# Patient Record
Sex: Male | Born: 1940 | Race: Black or African American | Hispanic: No | Marital: Single | Smoking: Former smoker
Health system: Southern US, Community
[De-identification: ages and names within clinical notes are randomized; demographics above are authoritative.]

## PROBLEM LIST (undated history)

## (undated) ENCOUNTER — Emergency Department (HOSPITAL_COMMUNITY): Admission: EM | Payer: Medicare Other | Source: Home / Self Care

## (undated) DIAGNOSIS — E785 Hyperlipidemia, unspecified: Secondary | ICD-10-CM

## (undated) DIAGNOSIS — N289 Disorder of kidney and ureter, unspecified: Secondary | ICD-10-CM

## (undated) DIAGNOSIS — I4891 Unspecified atrial fibrillation: Secondary | ICD-10-CM

## (undated) DIAGNOSIS — I509 Heart failure, unspecified: Secondary | ICD-10-CM

## (undated) DIAGNOSIS — K922 Gastrointestinal hemorrhage, unspecified: Secondary | ICD-10-CM

## (undated) DIAGNOSIS — H409 Unspecified glaucoma: Secondary | ICD-10-CM

## (undated) DIAGNOSIS — R0600 Dyspnea, unspecified: Secondary | ICD-10-CM

## (undated) DIAGNOSIS — C801 Malignant (primary) neoplasm, unspecified: Secondary | ICD-10-CM

## (undated) DIAGNOSIS — I10 Essential (primary) hypertension: Secondary | ICD-10-CM

## (undated) HISTORY — DX: Hyperlipidemia, unspecified: E78.5

## (undated) HISTORY — DX: Disorder of kidney and ureter, unspecified: N28.9

## (undated) HISTORY — DX: Essential (primary) hypertension: I10

## (undated) HISTORY — DX: Gastrointestinal hemorrhage, unspecified: K92.2

## (undated) HISTORY — DX: Unspecified glaucoma: H40.9

---

## 2001-12-02 ENCOUNTER — Emergency Department (HOSPITAL_COMMUNITY): Admission: EM | Admit: 2001-12-02 | Discharge: 2001-12-02 | Payer: Self-pay

## 2001-12-06 ENCOUNTER — Encounter (INDEPENDENT_AMBULATORY_CARE_PROVIDER_SITE_OTHER): Payer: Self-pay | Admitting: Specialist

## 2001-12-06 ENCOUNTER — Encounter: Payer: Self-pay | Admitting: Emergency Medicine

## 2001-12-07 ENCOUNTER — Inpatient Hospital Stay (HOSPITAL_COMMUNITY): Admission: EM | Admit: 2001-12-07 | Discharge: 2001-12-18 | Payer: Self-pay | Admitting: Emergency Medicine

## 2001-12-19 ENCOUNTER — Encounter: Admission: RE | Admit: 2001-12-19 | Discharge: 2001-12-19 | Payer: Self-pay

## 2001-12-21 ENCOUNTER — Inpatient Hospital Stay (HOSPITAL_COMMUNITY): Admission: EM | Admit: 2001-12-21 | Discharge: 2001-12-27 | Payer: Self-pay

## 2001-12-21 ENCOUNTER — Encounter: Payer: Self-pay | Admitting: Infectious Diseases

## 2001-12-22 ENCOUNTER — Encounter: Payer: Self-pay | Admitting: Infectious Diseases

## 2001-12-23 ENCOUNTER — Encounter: Payer: Self-pay | Admitting: Infectious Diseases

## 2003-09-28 ENCOUNTER — Encounter: Admission: RE | Admit: 2003-09-28 | Discharge: 2003-09-28 | Payer: Self-pay | Admitting: Cardiovascular Disease

## 2005-10-20 ENCOUNTER — Ambulatory Visit (HOSPITAL_COMMUNITY): Admission: RE | Admit: 2005-10-20 | Discharge: 2005-10-20 | Payer: Self-pay | Admitting: Cardiovascular Disease

## 2006-03-12 ENCOUNTER — Encounter: Admission: RE | Admit: 2006-03-12 | Discharge: 2006-03-12 | Payer: Self-pay | Admitting: Cardiovascular Disease

## 2008-10-15 HISTORY — PX: OTHER SURGICAL HISTORY: SHX169

## 2009-10-01 ENCOUNTER — Emergency Department (HOSPITAL_COMMUNITY): Admission: EM | Admit: 2009-10-01 | Discharge: 2009-10-01 | Payer: Self-pay | Admitting: Emergency Medicine

## 2009-10-09 ENCOUNTER — Emergency Department (HOSPITAL_COMMUNITY): Admission: EM | Admit: 2009-10-09 | Discharge: 2009-10-09 | Payer: Self-pay | Admitting: Emergency Medicine

## 2009-10-10 ENCOUNTER — Ambulatory Visit: Payer: Self-pay | Admitting: Pulmonary Disease

## 2009-10-10 ENCOUNTER — Inpatient Hospital Stay (HOSPITAL_COMMUNITY): Admission: EM | Admit: 2009-10-10 | Discharge: 2009-10-25 | Payer: Self-pay | Admitting: Emergency Medicine

## 2009-10-11 ENCOUNTER — Ambulatory Visit: Payer: Self-pay | Admitting: Thoracic Surgery

## 2009-10-11 ENCOUNTER — Encounter: Payer: Self-pay | Admitting: Internal Medicine

## 2009-10-11 HISTORY — PX: BRONCHOSCOPY: SUR163

## 2009-10-12 ENCOUNTER — Encounter: Payer: Self-pay | Admitting: Internal Medicine

## 2009-10-13 ENCOUNTER — Telehealth (INDEPENDENT_AMBULATORY_CARE_PROVIDER_SITE_OTHER): Payer: Self-pay | Admitting: *Deleted

## 2009-10-15 ENCOUNTER — Encounter: Payer: Self-pay | Admitting: Internal Medicine

## 2009-11-10 ENCOUNTER — Ambulatory Visit: Payer: Self-pay | Admitting: Thoracic Surgery

## 2009-11-10 ENCOUNTER — Encounter: Admission: RE | Admit: 2009-11-10 | Discharge: 2009-11-10 | Payer: Self-pay | Admitting: Thoracic Surgery

## 2009-11-22 ENCOUNTER — Ambulatory Visit: Payer: Self-pay | Admitting: Internal Medicine

## 2009-11-22 DIAGNOSIS — I10 Essential (primary) hypertension: Secondary | ICD-10-CM

## 2009-11-22 DIAGNOSIS — D0220 Carcinoma in situ of unspecified bronchus and lung: Secondary | ICD-10-CM

## 2009-11-22 DIAGNOSIS — R042 Hemoptysis: Secondary | ICD-10-CM | POA: Insufficient documentation

## 2009-11-30 ENCOUNTER — Encounter: Payer: Self-pay | Admitting: Internal Medicine

## 2009-11-30 LAB — COMPREHENSIVE METABOLIC PANEL
ALT: 14 U/L (ref 0–53)
AST: 23 U/L (ref 0–37)
Alkaline Phosphatase: 89 U/L (ref 39–117)
CO2: 23 mEq/L (ref 19–32)
Calcium: 9.3 mg/dL (ref 8.4–10.5)
Creatinine, Ser: 1.68 mg/dL — ABNORMAL HIGH (ref 0.40–1.50)
Glucose, Bld: 96 mg/dL (ref 70–99)
Sodium: 140 mEq/L (ref 135–145)
Total Bilirubin: 0.5 mg/dL (ref 0.3–1.2)
Total Protein: 7.2 g/dL (ref 6.0–8.3)

## 2009-11-30 LAB — CBC WITH DIFFERENTIAL/PLATELET
Eosinophils Absolute: 0.2 10*3/uL (ref 0.0–0.5)
LYMPH%: 22.2 % (ref 14.0–49.0)
MCHC: 33.8 g/dL (ref 32.0–36.0)
MONO#: 0.6 10*3/uL (ref 0.1–0.9)
NEUT%: 58.9 % (ref 39.0–75.0)
RDW: 14.5 % (ref 11.0–14.6)

## 2009-12-08 ENCOUNTER — Encounter: Admission: RE | Admit: 2009-12-08 | Discharge: 2009-12-08 | Payer: Self-pay | Admitting: Thoracic Surgery

## 2009-12-08 ENCOUNTER — Encounter: Payer: Self-pay | Admitting: Internal Medicine

## 2009-12-08 ENCOUNTER — Ambulatory Visit: Payer: Self-pay | Admitting: Thoracic Surgery

## 2010-01-21 ENCOUNTER — Ambulatory Visit: Payer: Self-pay | Admitting: Internal Medicine

## 2010-01-21 DIAGNOSIS — J449 Chronic obstructive pulmonary disease, unspecified: Secondary | ICD-10-CM

## 2010-01-21 DIAGNOSIS — J4489 Other specified chronic obstructive pulmonary disease: Secondary | ICD-10-CM | POA: Insufficient documentation

## 2010-03-02 ENCOUNTER — Ambulatory Visit: Payer: Self-pay | Admitting: Thoracic Surgery

## 2010-03-02 ENCOUNTER — Encounter: Payer: Self-pay | Admitting: Internal Medicine

## 2010-03-02 ENCOUNTER — Encounter: Admission: RE | Admit: 2010-03-02 | Discharge: 2010-03-02 | Payer: Self-pay | Admitting: Thoracic Surgery

## 2010-05-19 ENCOUNTER — Ambulatory Visit: Payer: Self-pay | Admitting: Internal Medicine

## 2010-05-25 ENCOUNTER — Ambulatory Visit (HOSPITAL_COMMUNITY): Admission: RE | Admit: 2010-05-25 | Discharge: 2010-05-25 | Payer: Self-pay | Admitting: Internal Medicine

## 2010-05-25 LAB — COMPREHENSIVE METABOLIC PANEL
AST: 33 U/L (ref 0–37)
CO2: 25 mEq/L (ref 19–32)
Glucose, Bld: 104 mg/dL — ABNORMAL HIGH (ref 70–99)
Potassium: 3.3 mEq/L — ABNORMAL LOW (ref 3.5–5.3)
Sodium: 141 mEq/L (ref 135–145)
Total Bilirubin: 0.8 mg/dL (ref 0.3–1.2)

## 2010-05-25 LAB — CBC WITH DIFFERENTIAL/PLATELET
BASO%: 0.5 % (ref 0.0–2.0)
Basophils Absolute: 0 10*3/uL (ref 0.0–0.1)
Eosinophils Absolute: 0.1 10*3/uL (ref 0.0–0.5)
HGB: 11.9 g/dL — ABNORMAL LOW (ref 13.0–17.1)
MCH: 30.9 pg (ref 27.2–33.4)
MCV: 89.5 fL (ref 79.3–98.0)
NEUT%: 63.3 % (ref 39.0–75.0)
Platelets: 160 10*3/uL (ref 140–400)
RDW: 14.5 % (ref 11.0–14.6)
WBC: 4.3 10*3/uL (ref 4.0–10.3)
lymph#: 1 10*3/uL (ref 0.9–3.3)

## 2010-05-30 ENCOUNTER — Encounter: Payer: Self-pay | Admitting: Internal Medicine

## 2010-06-01 ENCOUNTER — Encounter: Admission: RE | Admit: 2010-06-01 | Discharge: 2010-06-01 | Payer: Self-pay | Admitting: Thoracic Surgery

## 2010-06-01 ENCOUNTER — Ambulatory Visit: Payer: Self-pay | Admitting: Thoracic Surgery

## 2010-06-15 ENCOUNTER — Encounter: Admission: RE | Admit: 2010-06-15 | Discharge: 2010-06-15 | Payer: Self-pay | Admitting: Cardiovascular Disease

## 2010-08-08 ENCOUNTER — Emergency Department (HOSPITAL_COMMUNITY): Admission: EM | Admit: 2010-08-08 | Discharge: 2010-08-08 | Payer: Self-pay | Admitting: Emergency Medicine

## 2010-08-20 ENCOUNTER — Ambulatory Visit: Payer: Self-pay | Admitting: Internal Medicine

## 2010-08-21 ENCOUNTER — Inpatient Hospital Stay (HOSPITAL_COMMUNITY): Admission: EM | Admit: 2010-08-21 | Discharge: 2010-08-31 | Payer: Self-pay | Admitting: Emergency Medicine

## 2010-08-22 ENCOUNTER — Encounter: Payer: Self-pay | Admitting: Internal Medicine

## 2010-08-23 ENCOUNTER — Encounter: Admission: RE | Admit: 2010-08-23 | Payer: Self-pay | Admitting: Thoracic Surgery

## 2010-08-23 ENCOUNTER — Ambulatory Visit: Payer: Self-pay | Admitting: Thoracic Surgery

## 2010-09-29 ENCOUNTER — Telehealth (INDEPENDENT_AMBULATORY_CARE_PROVIDER_SITE_OTHER): Payer: Self-pay | Admitting: *Deleted

## 2010-10-13 ENCOUNTER — Encounter
Admission: RE | Admit: 2010-10-13 | Discharge: 2010-10-13 | Payer: Self-pay | Source: Home / Self Care | Attending: Cardiovascular Disease | Admitting: Cardiovascular Disease

## 2010-10-29 ENCOUNTER — Encounter: Payer: Self-pay | Admitting: Thoracic Surgery

## 2010-10-30 ENCOUNTER — Encounter: Payer: Self-pay | Admitting: Thoracic Surgery

## 2010-11-08 NOTE — Assessment & Plan Note (Signed)
Summary: rov after pft ///kp   CC:  Follow up visit after PFT.Rick Tyler  History of Present Illness: History of Present Illness: November 22, 2009- Hx hemoptysis, Carcinoma in situ, RLL lobectomy  Post hospital ([facility] 1/2-1/17/11) visit for this 70 yoM who presented through [facility] ER  with new onset episodic gross hemoptysis. Hospital notes are in [place]. CT showed no focal lesion, but suggested bleeding in right mid/lower lung.  I attempted bronchoscopy, aborted by renewed bleeding. He was bronchoscoped in the OR by Dr Edwyna Shell, discovering carcinoma -in-situ. He then had VATS Riight Lower Lobectomy([id number]). There has been no more bleeding. Residual post-thoracotomy pain, but endurance is improving. Hx of bleeding on coumadin for cardiomyopathy years ago. He was told he was a "bleeder and should never have coumadin". Recent ECHO by Dr Algie Coffer looked much better. He had quit ETOH and Tobacco years ago. He asks now if he should have some kind of adjuvant cancer therapy.  January 21, 2010- Hx hemoptysis, RLL lobectomy for carcinoma in situ Hitting a lot of golf balls. He notices a little post thoracotomy pull in right chest. Notices some dyspnea walking up very steep hill on golf course. Needs a rescue inhaler. He is intending to play Investment banker, corporate and expects to be able to shoot his age (!!). He denies productive cough, blood, chest pain, palpitation, syncope or ankle edema. PFT- Moderate restriction, mild obstruction with R to BD. DLCO moderately reduced 58%.  FEV1 2.23/ 70%; FEV1/FVC 0.73   Current Medications (verified): 1)  Lasix 20 Mg Tabs (Furosemide) .... Take 1 or 2  By Mouth Once Daily As Needed Lower Extremity Swelling 2)  Amiodarone Hcl 200 Mg Tabs (Amiodarone Hcl) .... Take 1 By Mouth Once Daily 3)  Carvedilol 12.5 Mg Tabs (Carvedilol) .... Take 1 By Mouth Two Times A Day 4)  Lipitor 10 Mg Tabs (Atorvastatin Calcium) .... Take 1 By Mouth Once Daily 5)  Nitrostat 0.4 Mg  Subl (Nitroglycerin) .... Use As Directed 6)  Tramadol Hcl 50 Mg Tabs (Tramadol Hcl) .Rick Tyler.. 1-2 Four Times A Day As Needed Pain  Allergies (verified): 1)  ! Coumadin  Past History:  Past Medical History: Hypertension GI Bleed, Hx Acute renal insufficiency- 2011 Cardiomyopathy. 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  The patient denies anorexia, fever, weight loss, weight gain, vision loss, decreased hearing, hoarseness, syncope, peripheral edema, prolonged cough, headaches, hemoptysis, abdominal pain, and severe indigestion/heartburn.    Vital Signs:  Patient profile:   70 year old male Height:      73 inches Weight:      187 pounds BMI:     24.76 O2 Sat:      94 % on Room air Pulse rate:   64 / minute BP sitting:   142 / 84  (left arm) Cuff size:   regular  Vitals Entered By: Reynaldo Minium CMA (January 21, 2010 10:45 AM)  O2 Flow:  Room air  Physical Exam  Additional Exam:  General: A/Ox3; pleasant and cooperative, NAD, tall, trim SKIN: no rash, lesions NODES: no lymphadenopathy HEENT: [place]/AT, EOM- WNL, Conjuctivae- clear, PERRLA, TM-WNL, Nose- clear, Throat- clear and wnl NECK: Supple w/ fair ROM, JVD- none, normal carotid impulses w/o bruits Thyroid- normal to palpation CHEST:  Clear to P&A, diminished, unlabored. Room air sat at rest 94% HEART: RRR, no m/g/r heard ABDOMEN: Soft and nl;  ZOX:WRUE, nl pulses, ? trace edema  NEURO: Grossly intact to observation      Impression & Recommendations:  Problem # 1:  COPD (ICD-496) Mild obstruction mainly in small airways, with reponse to bronchodilator. He also has mild to moderate restriction consistent with his lobectomy and known cardiomegaly.  I have handwritten a note supporting his request for  permission to use golf cart during upcoming tournament. We discussed and provided a rescue inhaler. He isn't needing mantenance Advair at this time.  Medications Added to Medication List This Visit: 1)  Proair Hfa 108 (90 Base) Mcg/act Aers (Albuterol sulfate) .... 2 puffs four times a day as needed rescue  Other Orders: Est. Patient Level III (45409) Prescription Created Electronically [phone])  Patient Instructions: 1)  Please schedule a follow-up appointment in 6 months. 2)  Golf cart note 3)  Script for rescue inhaler sent to your drug store. Prescriptions: PROAIR HFA 108 (90 BASE) MCG/ACT AERS (ALBUTEROL SULFATE) 2 puffs four times a day as needed rescue  #1 x prn   Entered and Authorized by:   Waymon Budge MD   Signed by:   Waymon Budge MD on 01/21/2010   Method used:   Electronically to        [facility]* (retail)       [address]       [place], [place]  [address]       Ph: [phone]       Fax: [phone]   RxID:   [id number]

## 2010-11-08 NOTE — Progress Notes (Signed)
Summary: transfer order  Phone Note From Other Clinic   Caller: Shanta at [facility] Call For: Young Summary of Call: Nurse at [facility] is asking for a transfer order for this patient. They are wanting to transfer pt to a tele bed or reg floor bed. Pt is currently in room 2609. Call back # [phone]. Please advise.  Initial call taken by: Carron Curie CMA,  October 13, 2009 9:58 AM  Follow-up for Phone Call        per CDY: okay to transfer pt to a medical floor, nontelemetry bed.  called spoke with [facility] and advised her of CDY's recs.  she verbalized her understanding. Boone Master CNA  October 13, 2009 11:03 AM

## 2010-11-08 NOTE — Assessment & Plan Note (Signed)
Summary: hfu/jd   CC:  Post hospital.  History of Present Illness: November 22, 2009- Hx hemoptysis, Carcinoma in situ, RLL lobectomy  Post hospital ([facility] 1/2-1/17/11) visit for this 70 yoM who presented through [facility] ER  with new onset episodic gross hemoptysis. Hospital notes are in [place]. CT showed no focal lesion, but suggested bleeding in right mid/lower lung.  I attempted bronchoscopy, aborted by renewed bleeding. He was bronchoscoped in the OR by Dr Edwyna Shell, discovering carcinoma -in-situ. He then had VATS Riight Lower Lobectomy([id number]). There has been no more bleeding. Residual post-thoracotomy pain, but endurance is improving. Hx of bleeding on coumadin for cardiomyopathy years ago. He was told he was a "bleeder and should never have coumadin". Recent ECHO by Dr Algie Coffer looked much better. He had quit ETOH and Tobacco years ago. He asks now if he should have some kind of adjuvant cancer therapy.   Preventive Screening-Counseling & Management  Alcohol-Tobacco     Smoking Status: quit  Current Medications (verified): 1)  Advair Diskus 250-50 Mcg/dose Aepb (Fluticasone-Salmeterol) .Marland Kitchen.. 1 Puff Two Times A Day and Rinse Mouth Well 2)  B Complex-Iron  Tabs (B Complex-C-Iron) .... Take 1 By Mouth Once Daily 3)  Lasix 20 Mg Tabs (Furosemide) .... Take 1 By Mouth Once Daily As Needed Lower Extremity Swelling 4)  Amiodarone Hcl 200 Mg Tabs (Amiodarone Hcl) .... Take 1 By Mouth Once Daily 5)  Carvedilol 12.5 Mg Tabs (Carvedilol) .... Take 1 By Mouth Two Times A Day 6)  Lipitor 10 Mg Tabs (Atorvastatin Calcium) .... Take 1 By Mouth Once Daily 7)  Nitrostat 0.4 Mg Subl (Nitroglycerin) .... Use As Directed  Allergies (verified): 1)  ! Coumadin  Past History:  Past Medical History: Hypertension GI Bleed, Hx Acute renal insufficiency- 2011 Cardiomyopathy. 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  Social History: Patient states former smoker. -remote ETOH, quit, remote Doctor, hospital, Golf ProSmoking Status:  quit  Review of Systems      See HPI       The patient complains of chest pain.  The patient denies anorexia, fever, weight loss, weight gain, vision loss, decreased hearing, hoarseness, syncope, dyspnea on exertion, peripheral edema, prolonged cough, headaches, hemoptysis, abdominal pain, and severe indigestion/heartburn.         Post thoracotomy pain. Resolved hemoptysis  Vital Signs:  Patient profile:   70 year old male Weight:      185.50 pounds O2 Sat:      100 % on Room air Pulse rate:   74 / minute BP sitting:   142 / 88  (right arm) Cuff size:   regular  Vitals Entered By: Reynaldo Minium CMA (November 22, 2009 3:33 PM)  O2 Flow:  Room air  Physical Exam  Additional Exam:  General: A/Ox3; pleasant and cooperative, NAD, tall, trim SKIN: no rash, lesions NODES: no lymphadenopathy HEENT: [place]/AT, EOM- WNL, Conjuctivae- clear, PERRLA, TM-WNL, Nose- clear, Throat- clear and wnl NECK: Supple w/ fair ROM, JVD- none, normal carotid impulses w/o bruits Thyroid- normal to palpation CHEST: Clear to P&A, diminished, unlabored. 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 Probably both problems were cured by Dr Scheryl Darter RLL resection. Since the bleeding implies vascular access to the cancer, and he would like oncology opinion about adjuvant therapy, I will make referral. He is taking tramadol and needs refill. We can check PFT. I don't know how much baseline lung  disease he has, if any.  Problem # 2:  HEMOPTYSIS UNSPECIFIED (ICD-786.30) He had a GI bleed in the past. I don't know if there is really any documented coagulopathy, but he was told years ago he was a "bleeder".He's had several weeks now of iron supplementation. He is back on a regular diet. We can check Hg, if Oncology hasn't, when he returns for a PFT. The following medications were removed from the medication list:    B Complex-iron Tabs (B complex-c-iron) .Marland Kitchen... Take 1 by mouth once daily His updated medication list for this problem includes:    Advair Diskus 250-50 Mcg/dose Aepb (Fluticasone-salmeterol) .Marland Kitchen... 1 puff two times a day and rinse mouth well  Medications Added to Medication List This Visit: 1)  Advair Diskus 250-50 Mcg/dose Aepb (Fluticasone-salmeterol) .Marland Kitchen.. 1 puff two times a day and rinse mouth well 2)  B Complex-iron Tabs (B complex-c-iron) .... Take 1 by mouth once daily 3)  Lasix 20 Mg Tabs (Furosemide) .... Take 1 by mouth once daily as needed lower extremity swelling 4)  Lasix 20 Mg Tabs (Furosemide) .... Take 1 or 2  by mouth once daily as needed lower extremity swelling 5)  Amiodarone Hcl 200 Mg Tabs (Amiodarone hcl) .... Take 1 by mouth once daily 6)  Carvedilol 12.5 Mg Tabs (Carvedilol) .... Take 1 by mouth two times a day 7)  Lipitor 10 Mg Tabs (Atorvastatin calcium) .... Take 1 by mouth once daily 8)  Nitrostat 0.4 Mg Subl (Nitroglycerin) .... Use as directed 9)  Tramadol Hcl 50 Mg Tabs (Tramadol hcl) .Marland Kitchen.. 1-2 four times a day as needed pain  Other Orders: Est. Patient Level III ([address]) Oncology Referral (Oncology)  Patient Instructions: 1)  Please schedule a follow-up appointment in 1 month. 2)  Schedule PFT 3)  Ok to drop off iron and Advair now. if we find you need them they can be restarted.  4)  OK to take 20 - 40 mg lasix/ furosemide daily as needed. Ask Dr Algie Coffer for guidance with this if you have questions. 5)  see [facility] to arrange referral to an  oncologist. 6)  Refill script for tramadol for chest pain Prescriptions: TRAMADOL HCL 50 MG TABS (TRAMADOL HCL) 1-2 four times a day as needed pain  #50 x 1   Entered and Authorized by:   Waymon Budge MD   Signed by:   Waymon Budge MD on 11/22/2009   Method used:   Print then Give to Patient   RxID:   [id number]

## 2010-11-08 NOTE — Miscellaneous (Signed)
Summary: follow-up  ---- Converted from flag ---- ---- 10/12/2009 11:48 AM, Philipp Deputy CMA wrote:   ---- 10/09/2009 6:30 PM, Kalman Shan MD wrote: Dr Sherrine Maples from [facility] ER called just now. Says patient presented to ER with 40cc hemoptysis. CT shows alveolar infiltrates mild. Patinet refusing admission due to dog and social issues. Says will sign out AMA. Patient agreeable for ouptatient followup apparently  I advised Dr Sherrine Maples to reiterate to patient that he should get admitted for serious condition. If patient refuses, then he is at his own risk. Patient could follow at our office [phone] on or after monday 10/11/2009. Advised that patient should call our office for appt. In any event, pls call patient and try to give him appt on monday. Told Dr. Sherrine Maples we cannot guarantee that he will be seen on 1/3 in an office for life threatening issue  Patient details" Rick Tyler 03/15/1941. Not in our system. phone is [phone]. ------------------------------  pt decided to stay in hospital. he is being followed by our MDs. he will be advised to f/u once discharged. Carron Curie CMA  October 12, 2009 4:54 PM

## 2010-11-08 NOTE — Letter (Signed)
Summary: [facility]  [facility]   Imported By: Lester Fillmore 03/16/2010 10:43:49  _____________________________________________________________________  External Attachment:    Type:   Image     Comment:   External Document

## 2010-11-08 NOTE — Letter (Signed)
Summary: [facility]  [facility]   Imported By: Lester Fall Branch 06/20/2010 08:32:22  _____________________________________________________________________  External Attachment:    Type:   Image     Comment:   External Document

## 2010-11-08 NOTE — Consult Note (Signed)
Summary: [facility]  [facility]   Imported By: Lennie Odor 12/29/2009 11:31:19  _____________________________________________________________________  External Attachment:    Type:   Image     Comment:   External Document

## 2010-11-08 NOTE — Miscellaneous (Signed)
Summary: Orders Update pft charges  Clinical Lists Changes  Orders: Added new Service order of Carbon Monoxide diffusing w/capacity (94720) - Signed Added new Service order of Lung Volumes (94240) - Signed Added new Service order of Spirometry (Pre & Post) (94060) - Signed 

## 2010-11-08 NOTE — Letter (Signed)
[facility]: [facility]   Imported By: Sherian Rein 04/04/2010 14:14:46  _____________________________________________________________________  External Attachment:    Type:   Image     Comment:   External Document

## 2010-11-10 NOTE — Progress Notes (Signed)
Summary: Increased use of Proair HFA  Phone Note Outgoing Call Call back at [facility] Phone [phone]   Call placed by: Reynaldo Minium CMA,  September 29, 2010 5:05 PM Call placed to: Patient Summary of Call: Karin Golden sent fax stating patient was using ProAir HFA inhaler too much-would like to see if CDY would like to give patient a steroid inhaler. Per CDY okay to have patient come by and get sample of Advair 250/50 and Proair HFA inhalers. Megan put samples at front and pt is aware to come tomorrow and pick up-once patient gets here I will review how to use Advair with him. Initial call taken by: Reynaldo Minium CMA,  September 29, 2010 5:13 PM

## 2010-11-29 ENCOUNTER — Ambulatory Visit (INDEPENDENT_AMBULATORY_CARE_PROVIDER_SITE_OTHER): Payer: Medicare Other | Admitting: Thoracic Surgery

## 2010-11-29 ENCOUNTER — Encounter: Payer: Self-pay | Admitting: Internal Medicine

## 2010-11-29 DIAGNOSIS — C3430 Malignant neoplasm of lower lobe, unspecified bronchus or lung: Secondary | ICD-10-CM

## 2010-11-30 NOTE — Assessment & Plan Note (Signed)
OFFICE VISIT  Rick Tyler, Rick Tyler DOB:  05-18-1941                                        November 29, 2010 CHART #:  [id number]  HISTORY:  The patient is a 70 year old black male who is status post right lower lobectomy and lymph node dissection for a squamous cell carcinoma of the right lower lobe.  He was seen on today's date in routine followup for reevaluation of a CT scan done for continued ongoing surveillance.  Currently, he reports no new symptoms and is overall feeling fairly well.  He denies hemoptysis.  He denies significant shortness of breath.  A chest CT scan was obtained on October 13, 2010, and was reviewed by Dr. Edwyna Shell.  It shows no active process, no evidence of recurrence of lung carcinoma.  There is no change in the calcified pleural plaques on the left side and there is resolution of previous foci of the airspace disease.  PHYSICAL EXAMINATION:  VITAL SIGNS:  Blood pressure 108/71, pulse 72, respirations 18, oxygen saturation 96% on room air. GENERAL APPEARANCE:  Well-developed adult male in no acute distress. PULMONARY:  Clear lungs. CARDIAC:  Irregularly irregular.  ASSESSMENT:  Rick Tyler is quite stable.  He does have a long history of atrial fibrillation and is under the care and management of Dr. Algie Coffer for this.  He is not a candidate for Coumadin therapy due to previous history of difficulties with bleeding.  He is quite stable from a thoracic surgical viewpoint.  He has elected not to follow up with Dr. Shirline Frees after seeing him on one time.  We will see him again in the office in 9 months with a repeat CT scan at that time.  [name], P.A.-C.  [name]  D:  11/29/2010  T:  11/30/2010  Job:  [id number]  cc:   Rick Tyler, M.D. Clinton D. Maple Hudson, MD, FCCP, FACP

## 2010-12-20 LAB — CULTURE, BLOOD (ROUTINE X 2)
Culture  Setup Time: 201111140005
Culture: NO GROWTH

## 2010-12-20 LAB — POCT I-STAT 3, ART BLOOD GAS (G3+)
O2 Saturation: 99 %
TCO2: 32 mmol/L (ref 0–100)
pH, Arterial: 7.368 (ref 7.350–7.450)

## 2010-12-20 LAB — BASIC METABOLIC PANEL
BUN: 30 mg/dL — ABNORMAL HIGH (ref 6–23)
BUN: 35 mg/dL — ABNORMAL HIGH (ref 6–23)
BUN: 40 mg/dL — ABNORMAL HIGH (ref 6–23)
BUN: 44 mg/dL — ABNORMAL HIGH (ref 6–23)
CO2: 22 mEq/L (ref 19–32)
CO2: 23 mEq/L (ref 19–32)
CO2: 28 mEq/L (ref 19–32)
CO2: 31 mEq/L (ref 19–32)
CO2: 31 mEq/L (ref 19–32)
Calcium: 8.4 mg/dL (ref 8.4–10.5)
Calcium: 8.8 mg/dL (ref 8.4–10.5)
Chloride: 102 mEq/L (ref 96–112)
Chloride: 103 mEq/L (ref 96–112)
Chloride: 105 mEq/L (ref 96–112)
Chloride: 110 mEq/L (ref 96–112)
Chloride: 110 mEq/L (ref 96–112)
Chloride: 98 mEq/L (ref 96–112)
Creatinine, Ser: 2.04 mg/dL — ABNORMAL HIGH (ref 0.4–1.5)
Creatinine, Ser: 2.07 mg/dL — ABNORMAL HIGH (ref 0.4–1.5)
Creatinine, Ser: 2.69 mg/dL — ABNORMAL HIGH (ref 0.4–1.5)
GFR calc Af Amer: 28 mL/min — ABNORMAL LOW (ref >60)
GFR calc Af Amer: 31 mL/min — ABNORMAL LOW (ref >60)
GFR calc Af Amer: 33 mL/min — ABNORMAL LOW (ref >60)
GFR calc Af Amer: 35 mL/min — ABNORMAL LOW (ref >60)
GFR calc Af Amer: 38 mL/min — ABNORMAL LOW (ref >60)
GFR calc Af Amer: 38 mL/min — ABNORMAL LOW (ref >60)
GFR calc non Af Amer: 24 mL/min — ABNORMAL LOW (ref >60)
GFR calc non Af Amer: 29 mL/min — ABNORMAL LOW (ref >60)
Glucose, Bld: 102 mg/dL — ABNORMAL HIGH (ref 70–99)
Glucose, Bld: 131 mg/dL — ABNORMAL HIGH (ref 70–99)
Glucose, Bld: 99 mg/dL (ref 70–99)
Potassium: 4.6 mEq/L (ref 3.5–5.1)
Potassium: 4.7 mEq/L (ref 3.5–5.1)
Potassium: 4.7 mEq/L (ref 3.5–5.1)
Potassium: 4.7 mEq/L (ref 3.5–5.1)
Potassium: 5.1 mEq/L (ref 3.5–5.1)
Sodium: 137 mEq/L (ref 135–145)
Sodium: 143 mEq/L (ref 135–145)

## 2010-12-20 LAB — BLOOD GAS, ARTERIAL
Acid-base deficit: 3.6 mmol/L — ABNORMAL HIGH (ref 0.0–2.0)
Bicarbonate: 20.5 mEq/L (ref 20.0–24.0)
Delivery systems: POSITIVE
Delivery systems: POSITIVE
Drawn by: 229971
Drawn by: 311371
Expiratory PAP: 6
FIO2: 0.5 %
FIO2: 1 %
Inspiratory PAP: 12
Inspiratory PAP: 12
Patient temperature: 98.6
Patient temperature: 98.6
TCO2: 21.5 mmol/L (ref 0–100)
TCO2: 25.4 mmol/L (ref 0–100)
pCO2 arterial: 34.3 mmHg — ABNORMAL LOW (ref 35.0–45.0)
pCO2 arterial: 35.9 mmHg (ref 35.0–45.0)
pCO2 arterial: 38.8 mmHg (ref 35.0–45.0)
pH, Arterial: 7.393 (ref 7.350–7.450)
pH, Arterial: 7.396 (ref 7.350–7.450)
pH, Arterial: 7.412 (ref 7.350–7.450)
pO2, Arterial: 50.4 mmHg — ABNORMAL LOW (ref 80.0–100.0)

## 2010-12-20 LAB — PROTIME-INR
INR: 1.17 (ref 0.00–1.49)
INR: 1.33 (ref 0.00–1.49)
Prothrombin Time: 15.1 seconds (ref 11.6–15.2)

## 2010-12-20 LAB — DIFFERENTIAL
Basophils Absolute: 0 10*3/uL (ref 0.0–0.1)
Basophils Relative: 0 % (ref 0–1)
Basophils Relative: 0 % (ref 0–1)
Basophils Relative: 1 % (ref 0–1)
Basophils Relative: 1 % (ref 0–1)
Eosinophils Absolute: 0.2 10*3/uL (ref 0.0–0.7)
Eosinophils Absolute: 0.3 10*3/uL (ref 0.0–0.7)
Eosinophils Relative: 3 % (ref 0–5)
Eosinophils Relative: 3 % (ref 0–5)
Lymphocytes Relative: 8 % — ABNORMAL LOW (ref 12–46)
Lymphs Abs: 0.5 10*3/uL — ABNORMAL LOW (ref 0.7–4.0)
Lymphs Abs: 0.7 10*3/uL (ref 0.7–4.0)
Lymphs Abs: 0.7 10*3/uL (ref 0.7–4.0)
Lymphs Abs: 0.9 10*3/uL (ref 0.7–4.0)
Monocytes Absolute: 1.2 10*3/uL — ABNORMAL HIGH (ref 0.1–1.0)
Monocytes Absolute: 1.2 10*3/uL — ABNORMAL HIGH (ref 0.1–1.0)
Monocytes Absolute: 1.2 10*3/uL — ABNORMAL HIGH (ref 0.1–1.0)
Monocytes Absolute: 1.5 10*3/uL — ABNORMAL HIGH (ref 0.1–1.0)
Monocytes Relative: 11 % (ref 3–12)
Monocytes Relative: 11 % (ref 3–12)
Monocytes Relative: 12 % (ref 3–12)
Monocytes Relative: 15 % — ABNORMAL HIGH (ref 3–12)
Neutro Abs: 7.4 10*3/uL (ref 1.7–7.7)
Neutro Abs: 7.7 10*3/uL (ref 1.7–7.7)
Neutro Abs: 8.5 10*3/uL — ABNORMAL HIGH (ref 1.7–7.7)
Neutrophils Relative %: 79 % — ABNORMAL HIGH (ref 43–77)
Neutrophils Relative %: 79 % — ABNORMAL HIGH (ref 43–77)
Neutrophils Relative %: 82 % — ABNORMAL HIGH (ref 43–77)

## 2010-12-20 LAB — PHOSPHORUS
Phosphorus: 3.7 mg/dL (ref 2.3–4.6)
Phosphorus: 3.8 mg/dL (ref 2.3–4.6)

## 2010-12-20 LAB — CARDIAC PANEL(CRET KIN+CKTOT+MB+TROPI)
CK, MB: 4.6 ng/mL — ABNORMAL HIGH (ref 0.3–4.0)
CK, MB: 5.7 ng/mL — ABNORMAL HIGH (ref 0.3–4.0)
Relative Index: 3.2 — ABNORMAL HIGH (ref 0.0–2.5)
Relative Index: 3.4 — ABNORMAL HIGH (ref 0.0–2.5)
Relative Index: 3.5 — ABNORMAL HIGH (ref 0.0–2.5)
Relative Index: INVALID (ref 0.0–2.5)
Relative Index: INVALID (ref 0.0–2.5)
Troponin I: 0.03 ng/mL (ref 0.00–0.06)
Troponin I: 0.04 ng/mL (ref 0.00–0.06)
Troponin I: 0.05 ng/mL (ref 0.00–0.06)

## 2010-12-20 LAB — CBC
HCT: 32.8 % — ABNORMAL LOW (ref 39.0–52.0)
HCT: 33.1 % — ABNORMAL LOW (ref 39.0–52.0)
HCT: 37.1 % — ABNORMAL LOW (ref 39.0–52.0)
HCT: 38.3 % — ABNORMAL LOW (ref 39.0–52.0)
Hemoglobin: 11 g/dL — ABNORMAL LOW (ref 13.0–17.0)
Hemoglobin: 11.3 g/dL — ABNORMAL LOW (ref 13.0–17.0)
Hemoglobin: 11.8 g/dL — ABNORMAL LOW (ref 13.0–17.0)
Hemoglobin: 11.9 g/dL — ABNORMAL LOW (ref 13.0–17.0)
MCH: 27.9 pg (ref 26.0–34.0)
MCH: 28 pg (ref 26.0–34.0)
MCH: 28.1 pg (ref 26.0–34.0)
MCH: 28.3 pg (ref 26.0–34.0)
MCHC: 32.1 g/dL (ref 30.0–36.0)
MCHC: 32.8 g/dL (ref 30.0–36.0)
MCHC: 33.1 g/dL (ref 30.0–36.0)
MCHC: 33.1 g/dL (ref 30.0–36.0)
MCHC: 33.2 g/dL (ref 30.0–36.0)
MCHC: 33.2 g/dL (ref 30.0–36.0)
MCV: 86.8 fL (ref 78.0–100.0)
MCV: 87.2 fL (ref 78.0–100.0)
Platelets: 204 10*3/uL (ref 150–400)
Platelets: 207 10*3/uL (ref 150–400)
Platelets: 252 10*3/uL (ref 150–400)
RBC: 3.86 MIL/uL — ABNORMAL LOW (ref 4.22–5.81)
RBC: 3.86 MIL/uL — ABNORMAL LOW (ref 4.22–5.81)
RBC: 3.91 MIL/uL — ABNORMAL LOW (ref 4.22–5.81)
RBC: 4.19 MIL/uL — ABNORMAL LOW (ref 4.22–5.81)
RBC: 4.21 MIL/uL — ABNORMAL LOW (ref 4.22–5.81)
RDW: 14.3 % (ref 11.5–15.5)
RDW: 14.3 % (ref 11.5–15.5)
RDW: 14.5 % (ref 11.5–15.5)
WBC: 10.8 10*3/uL — ABNORMAL HIGH (ref 4.0–10.5)
WBC: 4.6 10*3/uL (ref 4.0–10.5)
WBC: 8.6 10*3/uL (ref 4.0–10.5)
WBC: 8.8 10*3/uL (ref 4.0–10.5)

## 2010-12-20 LAB — EXPECTORATED SPUTUM ASSESSMENT W GRAM STAIN, RFLX TO RESP C

## 2010-12-20 LAB — URINE CULTURE: Culture: NO GROWTH

## 2010-12-20 LAB — PROCALCITONIN: Procalcitonin: 0.76 ng/mL

## 2010-12-20 LAB — TROPONIN I: Troponin I: 0.06 ng/mL (ref 0.00–0.06)

## 2010-12-20 LAB — COMPREHENSIVE METABOLIC PANEL
Alkaline Phosphatase: 78 U/L (ref 39–117)
CO2: 23 mEq/L (ref 19–32)
Chloride: 106 mEq/L (ref 96–112)
Sodium: 140 mEq/L (ref 135–145)
Total Protein: 6.9 g/dL (ref 6.0–8.3)

## 2010-12-20 LAB — BRAIN NATRIURETIC PEPTIDE
Pro B Natriuretic peptide (BNP): 1548 pg/mL — ABNORMAL HIGH (ref 0.0–100.0)
Pro B Natriuretic peptide (BNP): 432 pg/mL — ABNORMAL HIGH (ref 0.0–100.0)
Pro B Natriuretic peptide (BNP): 96 pg/mL (ref 0.0–100.0)

## 2010-12-20 LAB — HEPATIC FUNCTION PANEL
AST: 30 U/L (ref 0–37)
Albumin: 2.5 g/dL — ABNORMAL LOW (ref 3.5–5.2)
Total Bilirubin: 0.7 mg/dL (ref 0.3–1.2)
Total Protein: 6.3 g/dL (ref 6.0–8.3)

## 2010-12-20 LAB — MAGNESIUM: Magnesium: 2.1 mg/dL (ref 1.5–2.5)

## 2010-12-20 LAB — GLUCOSE, CAPILLARY: Glucose-Capillary: 102 mg/dL — ABNORMAL HIGH (ref 70–99)

## 2010-12-20 LAB — LIPID PANEL
Cholesterol: 112 mg/dL (ref 0–200)
HDL: 38 mg/dL — ABNORMAL LOW (ref >39)
Total CHOL/HDL Ratio: 2.9 RATIO

## 2010-12-20 LAB — POCT I-STAT 3, VENOUS BLOOD GAS (G3P V)
Acid-Base Excess: 3 mmol/L — ABNORMAL HIGH (ref 0.0–2.0)
O2 Saturation: 60 %
pO2, Ven: 33 mmHg (ref 30.0–45.0)

## 2010-12-20 LAB — APTT: aPTT: 39 seconds — ABNORMAL HIGH (ref 24–37)

## 2010-12-20 LAB — LACTIC ACID, PLASMA: Lactic Acid, Venous: 1.4 mmol/L (ref 0.5–2.2)

## 2010-12-20 LAB — CK TOTAL AND CKMB (NOT AT ARMC): Total CK: 238 U/L — ABNORMAL HIGH (ref 7–232)

## 2010-12-20 LAB — MRSA PCR SCREENING: MRSA by PCR: NEGATIVE

## 2010-12-20 LAB — HEPARIN LEVEL (UNFRACTIONATED): Heparin Unfractionated: 0.26 IU/mL — ABNORMAL LOW (ref 0.30–0.70)

## 2010-12-20 NOTE — Letter (Signed)
Summary: [facility]  [facility]   Imported By: Sherian Rein 12/14/2010 12:59:33  _____________________________________________________________________  External Attachment:    Type:   Image     Comment:   External Document

## 2010-12-21 LAB — CBC
HCT: 35.7 % — ABNORMAL LOW (ref 39.0–52.0)
Hemoglobin: 11.8 g/dL — ABNORMAL LOW (ref 13.0–17.0)
RDW: 13.8 % (ref 11.5–15.5)
WBC: 6.9 10*3/uL (ref 4.0–10.5)

## 2010-12-21 LAB — POCT CARDIAC MARKERS
CKMB, poc: 6.2 ng/mL (ref 1.0–8.0)
Myoglobin, poc: >500 ng/mL (ref 12–200)
Troponin i, poc: <0.05 ng/mL (ref 0.00–0.09)

## 2010-12-21 LAB — DIFFERENTIAL
Basophils Absolute: 0 10*3/uL (ref 0.0–0.1)
Lymphocytes Relative: 24 % (ref 12–46)
Monocytes Absolute: 0.9 10*3/uL (ref 0.1–1.0)
Neutro Abs: 4.2 10*3/uL (ref 1.7–7.7)
Neutrophils Relative %: 62 % (ref 43–77)

## 2010-12-21 LAB — POCT I-STAT, CHEM 8
BUN: 35 mg/dL — ABNORMAL HIGH (ref 6–23)
Calcium, Ion: 1.08 mmol/L — ABNORMAL LOW (ref 1.12–1.32)
Chloride: 108 mEq/L (ref 96–112)
Potassium: 4 mEq/L (ref 3.5–5.1)

## 2010-12-21 LAB — BRAIN NATRIURETIC PEPTIDE: Pro B Natriuretic peptide (BNP): 1333 pg/mL — ABNORMAL HIGH (ref 0.0–100.0)

## 2010-12-25 LAB — CBC
HCT: 25.2 % — ABNORMAL LOW (ref 39.0–52.0)
HCT: 25.9 % — ABNORMAL LOW (ref 39.0–52.0)
HCT: 28.4 % — ABNORMAL LOW (ref 39.0–52.0)
HCT: 29.7 % — ABNORMAL LOW (ref 39.0–52.0)
HCT: 31.4 % — ABNORMAL LOW (ref 39.0–52.0)
HCT: 35.2 % — ABNORMAL LOW (ref 39.0–52.0)
Hemoglobin: 10.3 g/dL — ABNORMAL LOW (ref 13.0–17.0)
Hemoglobin: 10.7 g/dL — ABNORMAL LOW (ref 13.0–17.0)
Hemoglobin: 12.2 g/dL — ABNORMAL LOW (ref 13.0–17.0)
Hemoglobin: 9.8 g/dL — ABNORMAL LOW (ref 13.0–17.0)
MCHC: 34.1 g/dL (ref 30.0–36.0)
MCHC: 34.3 g/dL (ref 30.0–36.0)
MCHC: 34.3 g/dL (ref 30.0–36.0)
MCHC: 34.5 g/dL (ref 30.0–36.0)
MCHC: 34.5 g/dL (ref 30.0–36.0)
MCHC: 34.7 g/dL (ref 30.0–36.0)
MCHC: 34.8 g/dL (ref 30.0–36.0)
MCV: 92.6 fL (ref 78.0–100.0)
MCV: 92.9 fL (ref 78.0–100.0)
MCV: 93.3 fL (ref 78.0–100.0)
MCV: 93.5 fL (ref 78.0–100.0)
Platelets: 126 10*3/uL — ABNORMAL LOW (ref 150–400)
Platelets: 130 10*3/uL — ABNORMAL LOW (ref 150–400)
Platelets: 138 10*3/uL — ABNORMAL LOW (ref 150–400)
Platelets: 139 10*3/uL — ABNORMAL LOW (ref 150–400)
Platelets: 150 10*3/uL (ref 150–400)
Platelets: 182 10*3/uL (ref 150–400)
Platelets: 186 10*3/uL (ref 150–400)
Platelets: 238 10*3/uL (ref 150–400)
RBC: 2.7 MIL/uL — ABNORMAL LOW (ref 4.22–5.81)
RBC: 2.8 MIL/uL — ABNORMAL LOW (ref 4.22–5.81)
RBC: 2.82 MIL/uL — ABNORMAL LOW (ref 4.22–5.81)
RBC: 3.04 MIL/uL — ABNORMAL LOW (ref 4.22–5.81)
RBC: 3.19 MIL/uL — ABNORMAL LOW (ref 4.22–5.81)
RBC: 4.23 MIL/uL (ref 4.22–5.81)
RDW: 12.8 % (ref 11.5–15.5)
RDW: 12.8 % (ref 11.5–15.5)
RDW: 12.9 % (ref 11.5–15.5)
RDW: 13.1 % (ref 11.5–15.5)
RDW: 13.2 % (ref 11.5–15.5)
WBC: 11.8 10*3/uL — ABNORMAL HIGH (ref 4.0–10.5)
WBC: 4.6 10*3/uL (ref 4.0–10.5)
WBC: 8.9 10*3/uL (ref 4.0–10.5)

## 2010-12-25 LAB — CROSSMATCH
ABO/RH(D): A POS
Antibody Screen: NEGATIVE

## 2010-12-25 LAB — COMPREHENSIVE METABOLIC PANEL
ALT: 28 U/L (ref 0–53)
Albumin: 2.7 g/dL — ABNORMAL LOW (ref 3.5–5.2)
Alkaline Phosphatase: 47 U/L (ref 39–117)
Alkaline Phosphatase: 61 U/L (ref 39–117)
BUN: 16 mg/dL (ref 6–23)
CO2: 26 mEq/L (ref 19–32)
Calcium: 8.6 mg/dL (ref 8.4–10.5)
Chloride: 107 mEq/L (ref 96–112)
Creatinine, Ser: 1.81 mg/dL — ABNORMAL HIGH (ref 0.4–1.5)
GFR calc Af Amer: 34 mL/min — ABNORMAL LOW (ref >60)
GFR calc non Af Amer: 37 mL/min — ABNORMAL LOW (ref >60)
Glucose, Bld: 102 mg/dL — ABNORMAL HIGH (ref 70–99)
Glucose, Bld: 109 mg/dL — ABNORMAL HIGH (ref 70–99)
Potassium: 3.6 mEq/L (ref 3.5–5.1)
Potassium: 4.7 mEq/L (ref 3.5–5.1)
Sodium: 131 mEq/L — ABNORMAL LOW (ref 135–145)
Total Bilirubin: 0.7 mg/dL (ref 0.3–1.2)
Total Protein: 5.8 g/dL — ABNORMAL LOW (ref 6.0–8.3)

## 2010-12-25 LAB — TYPE AND SCREEN: Antibody Screen: NEGATIVE

## 2010-12-25 LAB — BASIC METABOLIC PANEL
BUN: 11 mg/dL (ref 6–23)
BUN: 17 mg/dL (ref 6–23)
BUN: 19 mg/dL (ref 6–23)
BUN: 27 mg/dL — ABNORMAL HIGH (ref 6–23)
BUN: 30 mg/dL — ABNORMAL HIGH (ref 6–23)
BUN: 33 mg/dL — ABNORMAL HIGH (ref 6–23)
BUN: 35 mg/dL — ABNORMAL HIGH (ref 6–23)
BUN: 41 mg/dL — ABNORMAL HIGH (ref 6–23)
BUN: 23 mg/dL (ref 6–23)
BUN: 26 mg/dL — ABNORMAL HIGH (ref 6–23)
CO2: 23 mEq/L (ref 19–32)
CO2: 24 mEq/L (ref 19–32)
CO2: 24 mEq/L (ref 19–32)
CO2: 24 mEq/L (ref 19–32)
CO2: 26 mEq/L (ref 19–32)
CO2: 23 mEq/L (ref 19–32)
Calcium: 8.5 mg/dL (ref 8.4–10.5)
Calcium: 8.8 mg/dL (ref 8.4–10.5)
Calcium: 9 mg/dL (ref 8.4–10.5)
Calcium: 9.6 mg/dL (ref 8.4–10.5)
Calcium: 8.2 mg/dL — ABNORMAL LOW (ref 8.4–10.5)
Chloride: 106 mEq/L (ref 96–112)
Chloride: 107 mEq/L (ref 96–112)
Chloride: 108 mEq/L (ref 96–112)
Chloride: 110 mEq/L (ref 96–112)
Chloride: 110 mEq/L (ref 96–112)
Creatinine, Ser: 1.58 mg/dL — ABNORMAL HIGH (ref 0.4–1.5)
Creatinine, Ser: 1.64 mg/dL — ABNORMAL HIGH (ref 0.4–1.5)
Creatinine, Ser: 1.76 mg/dL — ABNORMAL HIGH (ref 0.4–1.5)
Creatinine, Ser: 1.79 mg/dL — ABNORMAL HIGH (ref 0.4–1.5)
Creatinine, Ser: 1.91 mg/dL — ABNORMAL HIGH (ref 0.4–1.5)
Creatinine, Ser: 1.56 mg/dL — ABNORMAL HIGH (ref 0.4–1.5)
Creatinine, Ser: 1.58 mg/dL — ABNORMAL HIGH (ref 0.4–1.5)
GFR calc Af Amer: 36 mL/min — ABNORMAL LOW (ref >60)
GFR calc Af Amer: 51 mL/min — ABNORMAL LOW (ref >60)
GFR calc Af Amer: 53 mL/min — ABNORMAL LOW (ref >60)
GFR calc non Af Amer: 30 mL/min — ABNORMAL LOW (ref >60)
GFR calc non Af Amer: 42 mL/min — ABNORMAL LOW (ref >60)
GFR calc non Af Amer: 42 mL/min — ABNORMAL LOW (ref >60)
GFR calc non Af Amer: 54 mL/min — ABNORMAL LOW (ref >60)
GFR calc non Af Amer: 44 mL/min — ABNORMAL LOW (ref >60)
GFR calc non Af Amer: 44 mL/min — ABNORMAL LOW (ref >60)
Glucose, Bld: 104 mg/dL — ABNORMAL HIGH (ref 70–99)
Glucose, Bld: 106 mg/dL — ABNORMAL HIGH (ref 70–99)
Glucose, Bld: 127 mg/dL — ABNORMAL HIGH (ref 70–99)
Glucose, Bld: 97 mg/dL (ref 70–99)
Glucose, Bld: 99 mg/dL (ref 70–99)
Glucose, Bld: 105 mg/dL — ABNORMAL HIGH (ref 70–99)
Glucose, Bld: 96 mg/dL (ref 70–99)
Potassium: 4.2 mEq/L (ref 3.5–5.1)
Potassium: 4.6 mEq/L (ref 3.5–5.1)
Potassium: 4.7 mEq/L (ref 3.5–5.1)
Sodium: 130 mEq/L — ABNORMAL LOW (ref 135–145)
Sodium: 133 mEq/L — ABNORMAL LOW (ref 135–145)
Sodium: 136 mEq/L (ref 135–145)

## 2010-12-25 LAB — FUNGUS CULTURE W SMEAR: Fungal Smear: NONE SEEN

## 2010-12-25 LAB — DIFFERENTIAL
Basophils Absolute: 0 10*3/uL (ref 0.0–0.1)
Basophils Relative: 0 % (ref 0–1)
Basophils Relative: 1 % (ref 0–1)
Eosinophils Relative: 0 % (ref 0–5)
Eosinophils Relative: 4 % (ref 0–5)
Lymphocytes Relative: 23 % (ref 12–46)
Lymphocytes Relative: 30 % (ref 12–46)
Lymphocytes Relative: 6 % — ABNORMAL LOW (ref 12–46)
Lymphs Abs: 1.4 10*3/uL (ref 0.7–4.0)
Monocytes Absolute: 0.5 10*3/uL (ref 0.1–1.0)
Monocytes Absolute: 2 10*3/uL — ABNORMAL HIGH (ref 0.1–1.0)
Monocytes Relative: 11 % (ref 3–12)
Monocytes Relative: 15 % — ABNORMAL HIGH (ref 3–12)
Neutro Abs: 10.3 10*3/uL — ABNORMAL HIGH (ref 1.7–7.7)
Neutro Abs: 2.4 10*3/uL (ref 1.7–7.7)
Neutro Abs: 2.5 10*3/uL (ref 1.7–7.7)
Neutrophils Relative %: 52 % (ref 43–77)

## 2010-12-25 LAB — CULTURE, RESPIRATORY W GRAM STAIN: Gram Stain: NONE SEEN

## 2010-12-25 LAB — URINALYSIS, ROUTINE W REFLEX MICROSCOPIC
Bilirubin Urine: NEGATIVE
Ketones, ur: NEGATIVE mg/dL
Nitrite: NEGATIVE
Nitrite: NEGATIVE
Protein, ur: NEGATIVE mg/dL
Specific Gravity, Urine: 1.022 (ref 1.005–1.030)
Urobilinogen, UA: 0.2 mg/dL (ref 0.0–1.0)
Urobilinogen, UA: 0.2 mg/dL (ref 0.0–1.0)
pH: 5.5 (ref 5.0–8.0)
pH: 5.5 (ref 5.0–8.0)

## 2010-12-25 LAB — GLUCOSE, CAPILLARY
Glucose-Capillary: 108 mg/dL — ABNORMAL HIGH (ref 70–99)
Glucose-Capillary: 126 mg/dL — ABNORMAL HIGH (ref 70–99)

## 2010-12-25 LAB — POCT I-STAT 3, ART BLOOD GAS (G3+)
Acid-base deficit: 1 mmol/L (ref 0.0–2.0)
Acid-base deficit: 1 mmol/L (ref 0.0–2.0)
Bicarbonate: 25.2 mEq/L — ABNORMAL HIGH (ref 20.0–24.0)
O2 Saturation: 95 %
Patient temperature: 36.9
Patient temperature: 99.3
TCO2: 27 mmol/L (ref 0–100)
pO2, Arterial: 77 mmHg — ABNORMAL LOW (ref 80.0–100.0)

## 2010-12-25 LAB — CULTURE, BLOOD (ROUTINE X 2): Culture: NO GROWTH

## 2010-12-25 LAB — PROTIME-INR
INR: 0.98 (ref 0.00–1.49)
Prothrombin Time: 12.9 seconds (ref 11.6–15.2)

## 2010-12-25 LAB — RENAL FUNCTION PANEL
Albumin: 1.9 g/dL — ABNORMAL LOW (ref 3.5–5.2)
Chloride: 108 mEq/L (ref 96–112)
GFR calc Af Amer: 39 mL/min — ABNORMAL LOW (ref >60)
GFR calc non Af Amer: 32 mL/min — ABNORMAL LOW (ref >60)
Potassium: 4 mEq/L (ref 3.5–5.1)

## 2010-12-25 LAB — URINE MICROSCOPIC-ADD ON

## 2010-12-25 LAB — BRAIN NATRIURETIC PEPTIDE
Pro B Natriuretic peptide (BNP): 298 pg/mL — ABNORMAL HIGH (ref 0.0–100.0)
Pro B Natriuretic peptide (BNP): 328 pg/mL — ABNORMAL HIGH (ref 0.0–100.0)

## 2010-12-25 LAB — SEDIMENTATION RATE: Sed Rate: 10 mm/hr (ref 0–16)

## 2010-12-25 LAB — NA AND K (SODIUM & POTASSIUM), RAND UR: Sodium, Ur: <10 mEq/L

## 2010-12-25 LAB — URINE CULTURE
Colony Count: NO GROWTH
Culture: NO GROWTH

## 2010-12-25 LAB — D-DIMER, QUANTITATIVE: D-Dimer, Quant: 0.34 ug/mL-FEU (ref 0.00–0.48)

## 2010-12-25 LAB — AFB CULTURE WITH SMEAR (NOT AT ARMC)

## 2010-12-25 LAB — EXPECTORATED SPUTUM ASSESSMENT W GRAM STAIN, RFLX TO RESP C

## 2010-12-25 LAB — APTT
aPTT: 29 seconds (ref 24–37)
aPTT: 30 seconds (ref 24–37)

## 2011-01-09 LAB — CBC
HCT: 38.8 % — ABNORMAL LOW (ref 39.0–52.0)
Platelets: 139 10*3/uL — ABNORMAL LOW (ref 150–400)
RDW: 13.1 % (ref 11.5–15.5)
WBC: 3.1 10*3/uL — ABNORMAL LOW (ref 4.0–10.5)

## 2011-01-09 LAB — DIFFERENTIAL
Basophils Absolute: 0 10*3/uL (ref 0.0–0.1)
Eosinophils Absolute: 0.1 10*3/uL (ref 0.0–0.7)
Eosinophils Relative: 5 % (ref 0–5)
Lymphocytes Relative: 31 % (ref 12–46)
Lymphs Abs: 1 10*3/uL (ref 0.7–4.0)
Neutrophils Relative %: 49 % (ref 43–77)

## 2011-02-13 ENCOUNTER — Ambulatory Visit
Admission: RE | Admit: 2011-02-13 | Discharge: 2011-02-13 | Disposition: A | Discharge status: Home / Self Care | Payer: Medicare Other | Source: Ambulatory Visit | Attending: Cardiovascular Disease | Admitting: Cardiovascular Disease

## 2011-02-13 ENCOUNTER — Other Ambulatory Visit: Payer: Self-pay | Admitting: Cardiovascular Disease

## 2011-02-13 DIAGNOSIS — R0602 Shortness of breath: Secondary | ICD-10-CM

## 2011-02-19 IMAGING — CR DG CHEST 1V PORT
1 series · 1 of 1 positions shown · non-contrast
Comparison: Portable chest x-rays yesterday and 08/20/2010.

CLINICAL DATA: Decompensated systolic heart failure.  Atrial
fibrillation with rapid ventricular response.

PORTABLE CHEST - 1 VIEW [DATE]/[id number] hours:

[AP]
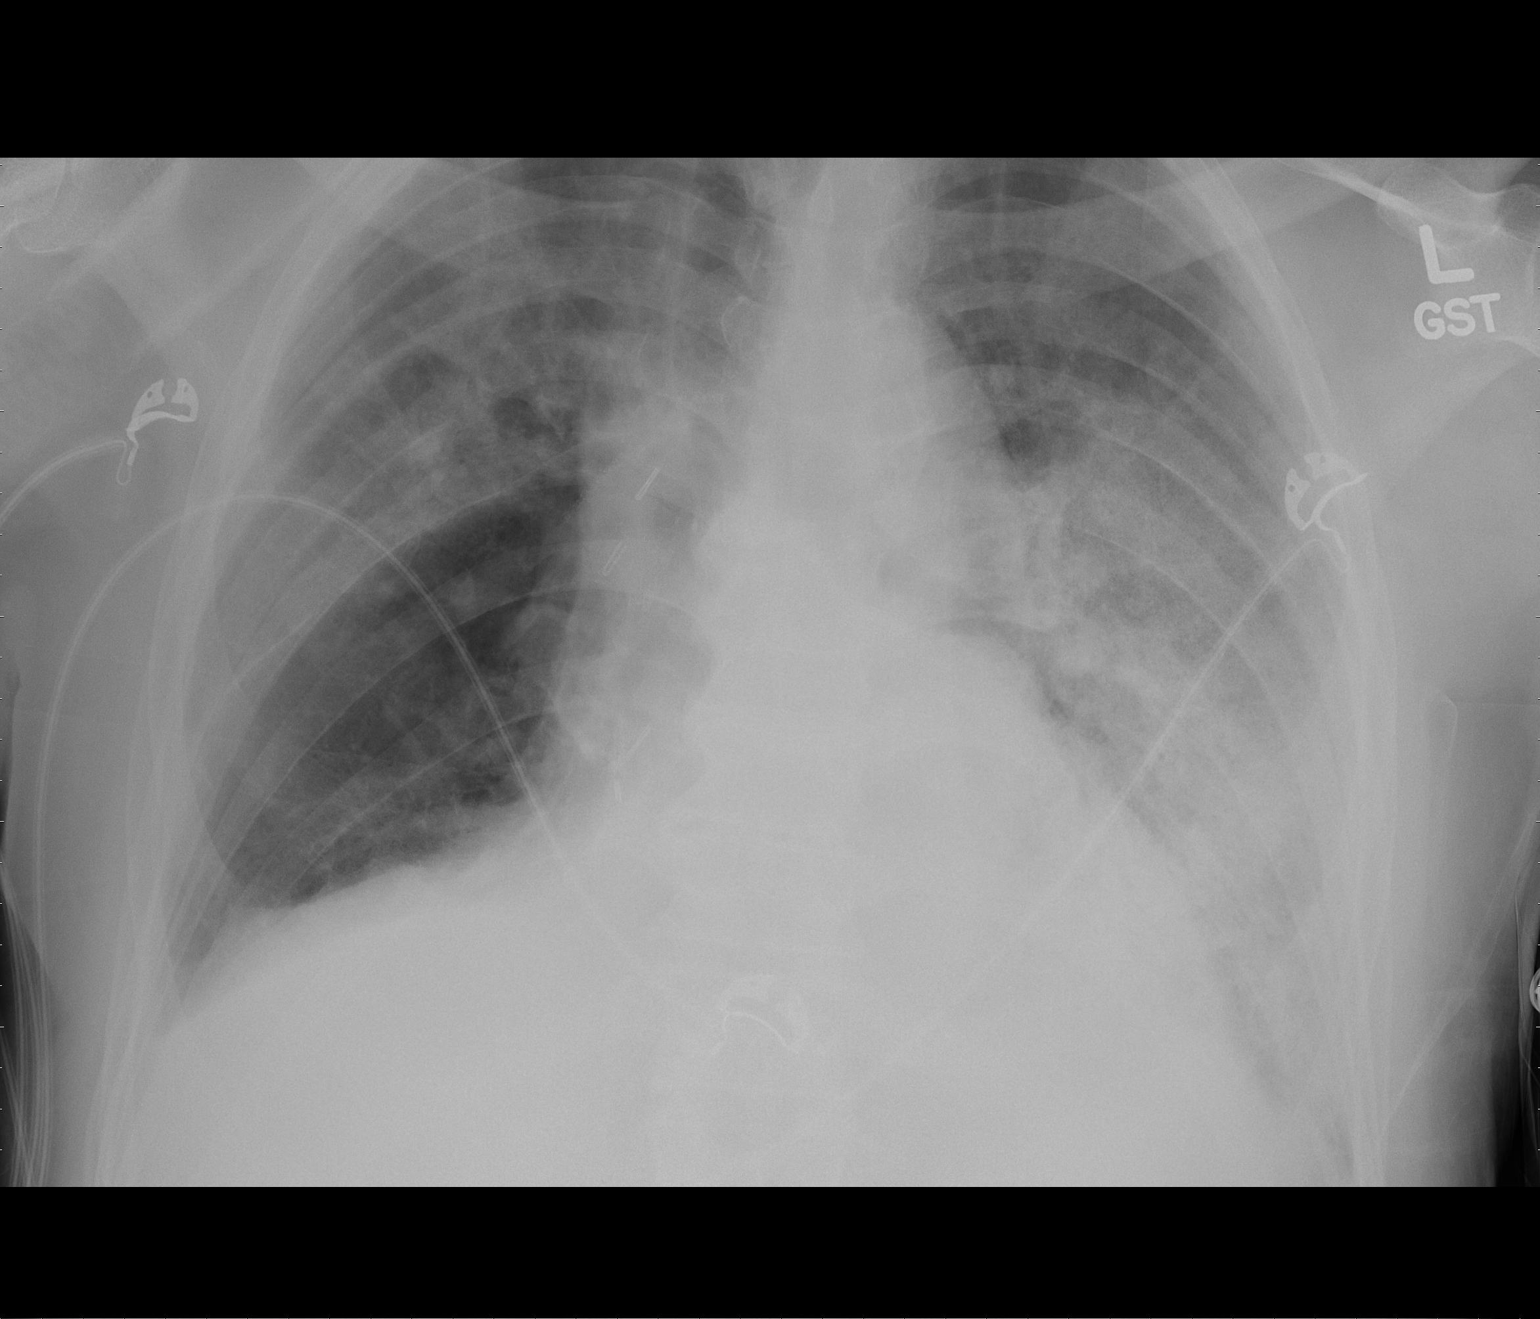

[1 of 1 positions shown; findings below may reference images not displayed]

FINDINGS: Stable airspace opacities throughout the left lung.
Significant improvement in aeration at the right lung base, with
only minimal residual airspace opacities persisting.  Improved
aeration in the right upper lobe as well, though mild airspace
opacities persist.  Stable bilateral pleural effusions, right
greater than left.  Stable moderate enlargement of the cardiac
silhouette.  No new abnormalities.
IMPRESSION: 1.  Stable airspace opacities throughout the left lung, asymmetric
airspace pulmonary edema versus pneumonia.
2.  Improved aeration in the right upper lobe and right lung base,
with only mild patchy airspace opacities persisting in the right
upper lobe.
3.  Stable bilateral pleural effusions, right greater than left.

## 2011-02-21 NOTE — Letter (Signed)
December 08, 2009   Clinton D. Maple Hudson, MD, FCCP, FACP  [facility]-Pulmonary [facility]  [address]  [place], [place] [address]   Re:  Rick Tyler, Rick Tyler             DOB:  [birth year]   Dear Joni Fears,   I saw the patient back for followup today.  He is doing much better.  He  states he is walking very well.  He has no shortness of breath.  His  blood pressure was 110/68, pulse 72, respirations 16, and sats were 98%.  He has seen Dr. Arbutus Ped and we will follow him and he will be seeing you  in the near future.  So far he has finally made a good recovery, he did  have a rough time for a while.  His chest x-ray showed normal  postoperative changes.  His lungs are clear to auscultation and  percussion.  I will see him back again after his CT scan in May.   Ines Bloomer, M.D.  Electronically Signed   DPB/MEDQ  D:  12/08/2009  T:  12/09/2009  Job:  [id number]

## 2011-02-21 NOTE — Letter (Signed)
Mar 02, 2010   Clinton D. Maple Hudson, MD, FCCP, FACP  [facility]-Pulmonary [facility]  [address]  [place], [place] [address]   Re:  CHALLEN, SPAINHOUR             DOB:  [birth year]   Dear Joni Fears,   I saw the patient in the office today.  He has had no hemoptysis.  He is  doing well with minimal pain.  His chest x-ray showed normal  postoperative changes.  Sats were 99%.  His blood pressure is 130/80,  pulse 70, respirations 18.  Plan to see him back again in 3 months with  a chest x-ray.   Ines Bloomer, M.D.  Electronically Signed   DPB/MEDQ  D:  03/02/2010  T:  03/03/2010  Job:  [id number]   cc:   [name], M.D.

## 2011-02-21 NOTE — Letter (Signed)
November 10, 2009   Clinton D. Maple Hudson, MD, FCCP, FACP  [facility]-Pulmonary [facility]  [address]  [place], [place] [address]   Re:  Rick Tyler, Rick Tyler             DOB:  [birth year]   Dear Joni Fears,   The patient comes today.  He has had no hemoptysis.  His chest x-ray  shows an interval improvement in the right lower lobe effusion after his  right lower lobectomy.  He is doing well overall.  His incisions are  well healed.  I told him to gradually increase his activity, and we  would see him back again in 3 weeks with a chest x-ray.   Sincerely,   Ines Bloomer, M.D.  Electronically Signed   DPB/MEDQ  D:  11/10/2009  T:  11/11/2009  Job:  [id number]

## 2011-02-21 NOTE — Letter (Signed)
June 01, 2010   Clinton D. Maple Hudson, MD, FCCP, FACP  [facility]-Pulmonary [facility]  [address]  [place], [place] [address]   Re:  Rick Tyler, Rick Tyler             DOB:  07-Jan-1941   Dear Joni Fears, I saw the patient back today and his CT scan showed a  recurrence of his cancer.  He is having some right shoulder pain and had  some popping when he moves his shoulder and I thought that he might have  seen orthopedic surgeon regarding this.  Apparently, Dr. Shirline Frees has  gotten blood work on him and showed that his creatinine is up to 2.3  with a potassium of 3.3, so I suggested that he follow up with you or  with his medical doctor regarding this and will see him back again in 6  months and we will get a CT scan without contrast.   Ines Bloomer, M.D.  Electronically Signed   DPB/MEDQ  D:  06/01/2010  T:  06/02/2010  Job:  [id number]   cc:   Lajuana Matte, MD

## 2011-02-24 NOTE — Discharge Summary (Signed)
[facility]  Patient:    Rick Tyler, Rick Tyler Visit Number: [id number] MRN: [id number]          Service Type: MED Location: [phone] Attending Physician:  Edwyna Perfect Dictated by:   Bonnell Public, M.D. Admit Date:  12/21/2001 Discharge Date: 12/27/2001   CC:         Rick Tyler, M.D.  Denman George, M.D.  Doylene Canning. Ladona Ridgel, M.D. [facility]   Discharge Summary  DATE OF BIRTH:  11-22-1940  DISCHARGE DIAGNOSES:  1. 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  2. Congestive heart failure.  3. Chronic renal insufficiency with creatinine baseline of 1.2-1.5.  4. Hypertension.  5. Atrial fibrillation.  6. Atrial flutter.  7. Status post electrical cardioversion to normal sinus rhythm.  8. Severe mitral regurgitation and tricuspid regurgitation.  9. Mild elevation of liver enzymes secondary to hepatic congestion. 10. Elevated high-density lipoprotein (dyslipidemia). 11. Heme positive stools with esophagogastroduodenoscopy showing antral     gastritis and 2 cm polyp removed from the descending colon.  The colon     showed adenomatous polyp with focal high-grade, glandular dysplasia.     Noninvasive carcinoma identified.  The stalk and base of the polyp were     free of adenomatous change.  DISCHARGE MEDICATIONS: 1. Amiodarone 200 mg p.o. q.d. 2. Aspirin 325 mg p.o. q.d. 3. Atenolol 50 mg p.o. q.d. 4. Captopril 12.5 mg p.o. b.i.d. 5. Digoxin 0.125 mg p.o. q.d. 6. Lasix 40 mg p.o. b.i.d. 7. Niacin 1 g p.o. q.h.s. 8. Protonix 40 mg p.o. q.d.  CONSULTATIONS: 1. Dr. Algie Coffer. 2. Dr. Madilyn Fireman. 3. Dr. Ladona Ridgel, [facility].  PROCEDURES: 1. Esophagogastroduodenoscopy performed on December 11, 2001.  Results as above. 2. Colonoscopy performed on December 11, 2001.  Results as above. 3. Transthoracic echocardiogram performed on December 13, 2001, showing mildly    improved left ventricular function with ejection fraction of 30%,  dilated    left atrium and right atrium.  No definite clot in the left atrial    appendage.  The patient underwent successful cardioversion with 100 jolts    synchronized to sinus rhythm.  No complications.  Surgeon Dr. Algie Coffer.  HISTORY OF PRESENT ILLNESS:  This is a 70 year old, African-American male with complaints of shortness of breath and chest discomfort for the last six months.  The patient denies any chest pain.  He was encouraged to come seek medical attention by his sister who is a Engineer, civil (consulting).  The patient has been visiting from [place] with plans to stay in [place] which is home town.  He states that he has had difficulty ambulating because he becomes very fatigued quickly.  He says he can only work 30 yards before he gets short of breath. The patient also complains of cough that continues with occasional sputum production and some blood.  No fevers or chills.  He does complain of lower extremity edema that is continuous that has been worsening over the last several days.  PAST MEDICAL HISTORY: 1. The patient had actually been in [place] prior to moving to [place] and    had had an episode of congestive heart failure in July 2002, which was the    first episode ever. 2. He states he also underwent cardiac catheterization at that time in [place]    at hospital in [place]. 3. He denies any history of myocardial infarction. 4. Records were later obtained and the patient had a clean catheterization. 5. History of  left pneumothorax secondary to trauma. 6. Status post skull fracture as a teenager secondary to trauma.  ALLERGIES:  No known drug allergies.  SOCIAL HISTORY:  The patient quit tobacco 22 years ago.  He previously had a history of two packs per day for the last 25 years and no alcohol or drugs.  FAMILY HISTORY:  Mother died at age 45 of liver cancer and father died of unknown causes.  He has health half-sister.  PHYSICAL EXAMINATION:  VITAL SIGNS:   Temperature 98.3, blood pressure 118/98, pulse 85, respiratory rate 24, oxygen saturations 93% on room air.  GENERAL:  This is a tall, thin, very talkative, African-American male in no apparent distress.  HEENT:  Normocephalic, atraumatic.  PERRL.  No oropharyngeal erythema.  The patient wears upper and lower dentures.  NECK:  There is 10 cm JVD with no lymphadenopathy and no masses.  LUNGS:  Clear to auscultation bilaterally.  HEART:  Regular rate and rhythm, S1, S2 with systolic ejection murmur 2/6 at the apex radiating to the axilla.  ABDOMEN:  Soft, nontender, nondistended with active bowel sounds.  NEUROLOGIC:  Alert and oriented x3.  Cranial nerves II-XII intact.  No motor sensory deficits.  LABORATORY DATA AND X-RAY FINDINGS:  Sodium 138, potassium 3.9, chloride 104, CO2 24, BUN 22, creatinine 1.5, glucose 87.  White blood cell count 61, hemoglobin 15.5, platelets 232.  BNP greater than 1300.  CK 172, CK-MB 2.9, relative index 1.7, troponin 0.11.  Chest x-ray with no infiltrates and no effusions.  EKG with 98 beats per minute with atrial pacemaker.  HOSPITAL COURSE:  #1 - CONGESTIVE HEART FAILURE:  Given his symptoms, it appeared that it was right heart failure.  The patient was started on Lasix for diuresis and a 2D echocardiogram was obtained.  The 2D echocardiogram showed a dilated left ventricle with overall left ventricular systolic function being markedly decreased.  Ejection fraction was estimated at 20-30%. There was severe diffuse, left ventricular hypokinesis and there was dyskinesis of the basal mid anterior septal wall.  He had severe mitral valve regurgitation and the left atrium was moderately to markedly dilated as was  the right atrium.  The patient also had right ventricular dilation and the right ventricular systolic function was moderately to markedly reduced.  There was severe tricuspid valve regurgitation.  The inferior vena cava was also noted to  be markedly dilated.  The patient was treated with Lasix and started on digoxin, beta-blocker and ACE inhibitor.  The patient continued to diurese appropriately and was actually doing very well by the time of discharge in relation to his congestive heart failure.  He was ultimately diagnosed with dilated cardiomyopathy with an ejection fraction of 25-35%.  #2 - ATRIAL FIBRILLATION/ATRIAL FLUTTER:  The patient came to [name] already on amiodarone 200 mg a day, but he was not able to tell us why.  Having him on the monitor turned out to be a good decision because we discovered that he had paroxysmal atrial fibrillation and atrial flutter.  Cardiologist and electrophysiologist saw the patient.  It was determined that he would probably benefit from cardioversion procedure.  In fact, he underwent an electrical cardioversion, TEE guided, and successfully returned to sinus rhythm.  The patient was to continue on Coumadin for the next four weeks.  He was also continued on digoxin and amiodarone.  #3 - HYPERTENSION:  The patient was started on Captopril and Atenolol for control of his blood pressure as well as for benefits for  his heart failure.  #4 - ELEVATED HEPATIC ENZYMES:  The patient has slight elevation in his hepatic enzymes.  This was really not of concern and it was attributed to passive congestion of the liver secondary to his heart failure.  #5 - CHRONIC RENAL INSUFFICIENCY:  The patient appears to have had this problem prior to admission.  His creatinine remained between 1.2 and 1.6 during this hospitalization.  #6 - HEME POSITIVE STOOL:  The patient was being considered for treatment with Coumadin, status post cardioversion.  Hemoccult exam showed a slightly positive card.  The patient did not have any hemorrhoids.  He did report a history of having been on Coumadin in the past and having an episode of gastrointestinal bleeding per rectum.  Gastroenterology consult was  therefore obtained to further explore this issue.  EGD showed antral gastritis and colonoscopy showed a dysplastic polyp that was removed.  The patient was deemed stable to start on Coumadin within a few days after discharge.  #7 - DYSLIPIDEMIA:  The patient had a very nice looking lipid panel with a normal cholesterol and LDL below 100.  However, his HDL was also low at 39 which was concerning.  Therefore, the patient was started on Niacin 1 g per day.  #8 - MODERATE MITRAL REGURGITATION AND TRICUSPID REGURGITATION:  This was discovered per 2D echocardiogram.  There was no concern for immediate referral for surgery at this point. Dictated by:   Bonnell Public, M.D. Attending Physician:  Edwyna Perfect DD:  02/07/02 TD:  02/07/02 Job: [id number]

## 2011-02-24 NOTE — Procedures (Signed)
[place]. [facility]  Patient:    Rick Tyler, Rick Tyler Visit Number: [id number] MRN: [id number]          Service Type: MED Location: [phone] Attending Physician:  Edwyna Perfect Dictated by:   Llana Aliment. Randa Evens, M.D. Proc. Date: 12/21/01 Admit Date:  12/21/2001   CC:         Alvester Morin, M.D.  John C. Madilyn Fireman, M.D.  Jimmye Norman, M.D.   Procedure Report  PROCEDURE:  Sigmoidoscopy with control of bleeding.  MEDICATIONS:  Versed 3 mg IV.  SCOPE:  Adult Olympus video colonoscope.  INDICATIONS FOR PROCEDURE:  Nice gentleman who had colonoscopy with polypectomy about a week ago, was subsequently cardioverted and sent home on Coumadin and Lovenox with two days of acute bleeding.  He has continued to bleed.  He has dropped his hemoglobin from 12 to 6.  He has been hypotensive with a systolic pressure as low as in the 60s.  This procedure is done as an attempt to control the bleeding.  DESCRIPTION OF PROCEDURE:  The procedure was done in the unprepped state.  We had explained it to the patient.  He was informed that the procedure may be unsuccessful due to the inability to localize a bleeding site.  The polypectomy site was known to be in the descending colon approximately 50 cm.  The scope was inserted.  There was a large amount of blood and clots and some time was spent irrigating and coagulating and sucking out blood.  We were eventually able to advance up to about 90 cm.  He had pandiverticular disease. There was blood everywhere.  There was bright blood, as well as clots.  We irrigated vigorously as we withdrew, and had about 45 to 50 cm, did find the polypectomy site.  It had an adherent quadrant ulceration.  Using the Endoclips, two clips were placed at the base, and one across the top with apparent good control of bleeding.  This was documented photographically and the scope withdrawn.  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  ASSESSMENT:   Bleeding probably at the polypectomy site with site clipped with Endoclips with apparent control of bleeding.  PLAN:  We will continue with correction of coags, and follow up for possible recurrent bleeding.  We will also obtain surgical consultation so they can follow him with [name]. Dictated by:   Llana Aliment. Randa Evens, M.D. Attending Physician:  Edwyna Perfect DD:  12/21/01 TD:  12/23/01 Job: [id number]

## 2011-02-24 NOTE — Discharge Summary (Signed)
. [facility]  Patient:    Rick Tyler, Rick Tyler Visit Number: [id number] MRN: [id number]          Service Type: MED Location: [phone] Attending Physician:  Edwyna Perfect Dictated by:   Linzie Collin, Medical Student Admit Date:  12/21/2001 Discharge Date: 12/27/2001   CC:         Rick Tyler, M.D.   Discharge Summary  DISCHARGE DIAGNOSES:  1. Congestive heart failure.  2. Dilated cardiomyopathy, probably nonischemic.  3. Atrial flutter, status post transesophageal echocardiogram-guided     cardioversion to normal sinus rhythm on December 13, 2001.  4. Mitral regurgitation/tricuspid regurgitation.  5. History of gastrointestinal bleed on Coumadin in 2002.  6. Status post esophagogastroduodenoscopy on December 12, 2001 with antral     gastritis.  7. Status post colonoscopy on December 12, 2001 with pan diverticulosis.  8. Hepatic congestion secondary to congestive heart failure.  DISCHARGE MEDICATIONS:  1. Lasix at 40 mg p.o. b.i.d.  2. Digoxin at 0.125 mg p.o. q.d.  3. Atenolol at 50 mg p.o. q.d.  4. Captopril at 12.5 mg p.o. q.d.  5. Amiodarone at 200 mg p.o. q.d.  6. Protonix at 40 mg p.o. q.d.  7. Aspirin at 325 mg p.o. q.d.  8. Niacin at 1 g p.o. q.d.  9. Coumadin - dose by pharmacy. 10. Lovenox - dose by pharmacy. 11. K-Dur at 40 mEq q.d.  CONSULTATIONS:  1. Dr. Ricki Tyler, Cardiologist.  2. Dr. Doylene Canning. Rick Tyler, Cardiologist.  3. Dr. Everardo All. Madilyn Fireman, Solicitor.  PRIMARY CARE PHYSICIAN:  Not known.  PROCEDURES:  1. A 2-D echocardiogram was done on December 09, 2001:  It demonstrated a dilated     LV, normal LV wall thickness, LVEF = 20-30%, diffuse LV hypokinesis, LA     dilation, RV with systolic function moderate to marked reduced, RA     dilation,  trivial AR, severe MR, trivial PR, severe TR.  2. Transesophageal echocardiogram with cardioversion done on December 13, 2001:     demonstrated a normal left atrial  appendage size, no thrombus identified,     cardioversion to normal sinus rhythm with first degree A-V block.  3. Colonoscopy with polypectomy done on December 12, 2001:  demonstrated pan     colonic diverticulosis, a large 2-2.5 cm pedunculated polyp in the     descending colon, which was removed by snare - pathology demonstrated     high-grade glandular dysplasia with no invasive carcinoma.  4. EGD with biopsy done on December 12, 2001:  demonstrated antral gastritis with     no focal erosions or ulcers.  The CLOtest was urease negative.  HISTORY OF PRESENT ILLNESS:  This is a 70 year old African-American male with a past medical history significant for 1)  CHF diagnosed in June 2002, 2) Atrial flutter, 3)  History of GI bleed on Coumadin in 2002, 4) Hypertension, 5) CRI - admitted on December 06, 2001 for shortness of breath, chest discomfort, worsening lower extremity edema bilaterally.  The patient was diagnosed with CHF in June 2002 in [facility], [place], [place].  The work-up at that time was notable for atrial flutter and negative coronary artery disease by cardiac catheterization.  The patient at that time was treated with IV diuretics and discharged with amiodarone at 200 mg q.d., Lasix at 80 mg p.o. b.i.d., Atenolol at 50 mg p.o. q.d., Captopril at 12.5 mg p.o. q.d., aspirin at 325 mg p.o. q.d., KCl at  40 mEq p.o. q.d., and Coumadin.  The patient was compliant with his medications. He was noted to have a GI bleed on Coumadin and a questionable INR, and it was subsequently discontinued.  The patient notes that over the past several weeks, he experienced progressive dyspnea and weight gain.  He noted increasing size of his legs bilaterally. He had generalized chest discomfort, but no chest pain with exertion, no diaphoresis, no jaw or left arm pain.  His vitals were not available at the time of this dictation.  PHYSICAL EXAMINATION:  HEENT:  An 8 cm JVD, no  bruits.  CARDIOVASCULAR:  Irregular rate and rhythm with a 3/6 systolic ejection murmur at the apex radiating to the axilla, no heaves.  LUNGS:  Bilateral basilar crackles and no consolidations.  GASTROINTESTINAL EXAMINATION:  Positive for bowel sounds.  Positive hepatojugular reflex.  He was nontender and mild distention.  EXTREMITIES:  Had 2-3+ pitting edema bilaterally to his thighs.  LABORATORIES:  Sodium 138, potassium 3.9, chloride 104, bicarbonate 24, BUN 22, creatinine 1.5, glucose 87, calcium 9.1, magnesium 2.1, albumin 3.0, AST 50, ALT 37, alkaline phosphatase 156, T-bili 1.5.  WBC 6.1, hemoglobin 15.5, platelets 232, ______ greater than 1300.  TSH 0.514.  Cardiac enzymes:  Set #1:  CK = 172, CK-MB = 2.9, troponin = 0.11.  Set #2:  CK = 118, CK-MB = 2.0, troponin = 0.09.  Set #3:  CK-MB = 2.1, troponin 0.11.  A chest x-ray demonstrated cardiomegaly with mild pulmonary vascular congestion without edema.  An EKG demonstrated atrial flutter with CVR and no ischemic changes.  A UA was as follows:  specific gravity is 1.016, pH 5.5, negative glucose, negative ketones, negative blood, negative nitrates, negative leukocyte esterase, protein = 30 mg/dL.  His cholesterol panel was a total of 142, triglycerides 43, HDL 22, LDL 111.  His ABG on 2 liters of oxygen = 7.8/32.5/116/24/99%  HOSPITAL COURSE: #1 - CONGESTIVE HEART FAILURE:  The patient was admitted and Lasix was started at 80 mg p.o. b.i.d.  The other following medicines were continued:  Captopril at 12.5 mg p.o. q.d., Atenolol at ______ mg p.o. q.d.   During the course of his hospitalization, the patient demonstrated good diuresis.  His weight changed from 192.8 to 167.6 pounds, approximating a 25 pound weight loss over the 2-week admission.  Diuresis was titrated to renal profusion, measured by the serum creatinine, and by clinical symptoms.  Creatinine on admission was 1.5 and at discharge 1.3.  Digoxin was loaded  at 0.25 and maintained at a dose  of 0.125 mg p.o. q.d. to maximize cardiac output.  The patients dyspnea, chest discomfort, lower extremity edema resolved by the time of discharge. The etiology of CHF were investigated.  An echocardiogram was done on December 12, 2001 and it demonstrated dilated cardiomyopathy and MR/TR.  The dilated cardiomyopathy was suspected as secondary to alcohol abuse.  It was decided not to do a repeat cardiac catheterization as the report from [facility] in [place], [place] was negative for coronary artery disease.  #2 - ATRIAL FLUTTER:  Consulted Dr. Ricki Tyler and Dr. Doylene Canning. Rick Tyler. A TEE-guided cardioversion was performed on December 13, 2001.  The patient has been in normal sinus rhythm with primary A-V block ______ .  The patient was maintained on amiodarone at 200 mg p.o. q.d.  After GI procedures were performed to rule out sources for a GI bleed, the patient was placed on Coumadin with a Lovenox bridge.  The  patient was advised to monitor his stools for blood.  His INR at discharge was 1.3 and his hemoglobin was 13.6.  The patient was scheduled to follow in Coumadin clinic to titrate his Coumadin to a goal of 2.0 to a goal INR of 2.0 to 3.0.  The patient was discharged with Lovenox to be administered by his sister, who is a nurse until his Coumadin was therapeutic.  As an outpatient, the patients INR and hemoglobin will be followed.  #3 - ANTRAL GASTRITIS:  Diagnosed by EGD.  Etiology may be secondary to NSAID use.  The patient has no focal ulcerations or erosions.  The patient was started on Protonix at 40 mg p.o. q.d.  #4 - PAN DIVERTICULOSIS:  Diagnosed by colonoscopy.  At that time, one polyp was removed, which demonstrated high-grade dysplasia.  As an outpatient, we will discuss with the patient when another colonoscopy should be repeated.  #5 - HEPATIC CONGESTION:  It improved with enhanced cardiac output and diuresis.  On December 17, 2001, the T-bili decreased to 0.8, the AFT decreased to 41, the ALT was 33, and the alkaline phosphatase decreased to 117.  As a note, Coumadin will have to be managed very carefully as the patient is prone to intermittent recurrent hepatic congestion.  #6 - CORONARY ARTERY DISEASE PREVENTION:  The patient had a cardiac catheterization at [facility] in 2002.  It demonstrated no coronary artery disease.  At this time, we will modify risk factors for developing coronary artery disease.  Blood pressure was controlled carefully with Atenolol at ______ mg p.o. q.d. and Captopril was increased to 12.5 mg b.i.d. The cholesterol panel had decreased HDL = 22.  We started the patient on Niacin at 500 mg p.o. q.d. and titrated it to 1000 mg p.o. q.d.  The patient tolerated it with no flushing.  Aspirin ASA at 325 mg q.d. was continued. Will continue to monitor blood pressure, cholesterol and clinical symptoms on aspirin as an outpatient.  At the time of discharge, the patient was extensively counseled on the benefits of continuing Coumadin and carefully monitoring for signs of bleeding compared to the risks of being on the therapy.  The patient agreed to follow up closely as an outpatient for Coumadin management.  FOLLOW/UP:  1. Outpatient clinic on Wednesday, December 25, 2001 at 12:30.  2. Coumadin clinic on Thursday, December 19, 2001 at 2:30.  3. Dr. Ricki Tyler on Wednesday, January 01, 2002 at 1:30. Dictated by:   Linzie Collin, Medical Student Attending Physician:  Edwyna Perfect DD:  12/31/01 TD:  12/31/01 Job: [id number]

## 2011-02-24 NOTE — Procedures (Signed)
[place]  Patient:    Rick Tyler, Rick Tyler Visit Number: [id number] MRN: [id number]          Service Type: MED Location: [phone] Attending Physician:  Phifer, Trinna Post Dictated by:   Everardo All. Madilyn Fireman, M.D. Proc. Date: 12/12/01 Admit Date:  12/06/2001   CC:         C. Ulyess Mort, M.D.   Procedure Report  PROCEDURE:  Esophagogastroduodenoscopy with biopsy.  SURGEON:  John C. Madilyn Fireman, M.D.  INDICATIONS FOR PROCEDURE:  Heme positive stool in a patient who is being considered for chronic anticoagulation.  Colonoscopy just prior to this procedure revealed a large colon polyp and colonic diverticulosis.  DESCRIPTION OF PROCEDURE:  The patient was placed in the left lateral decubitus position and placed on the pulse monitor with continuous low flow oxygen delivered by nasal cannula.  He was sedated with 50 mg of IV Demerol and 6 mg of IV Versed.  No further sedation was required for this procedure.  The Olympus video endoscope was advanced under direct vision into the oropharynx and esophagus.  The esophagus was straight and of normal caliber with the squamocolumnar line at 38 cm.  There is no visible hiatal hernia, ring, stricture, or other abnormality at the GE junction.  The stomach was entered and a small amount of liquid secretions were suctioned from the fundus.  Retroflexed view of the cardia was unremarkable.  The fundus, body, appeared normal.  The antrum showed some fairly intense patches of erythema and granularity, but no focal erosions or ulcers.  A CLOtest was obtained. The pylorus was non-deformed, and easily allowed passage of the endoscope tip into the duodenum.  Both the bulb and second portion were well-inspected and appeared to be within normal limits.  The scope was then withdrawn and the patient returned to the recovery room in stable condition.  He tolerated the procedure well and there were no  immediate complications.  IMPRESSION:  Antral gastritis, otherwise normal endoscopy.  PLAN:  Await CLOtest and treat for irradication of Helicobacter if positive. Dictated by:   Everardo All Madilyn Fireman, M.D. Attending Physician:  Phifer, Trinna Post DD:  12/12/01 TD:  12/13/01 Job: [id number]

## 2011-02-24 NOTE — Consult Note (Signed)
[place]. [facility]  Patient:    Rick Tyler, Rick Tyler Visit Number: [id number] MRN: [id number]          Service Type: MED Location: [phone] Attending Physician:  Levy Sjogren Dictated by:   Llana Aliment. Randa Evens, M.D. Admit Date:  12/21/2001   CC:         C. Ulyess Mort, M.D.  John C. Madilyn Fireman, M.D.   Consultation Report  DATE OF BIRTH:  22-Jul-1941  HISTORY:  A nice 71 year old gentleman who was admitted acutely with GI bleeding.  He was hospitalized approximately two weeks ago.  Had heme-positive stools, being considered for chronic anticoagulation due to atrial fibrillation.  On March 6 he had an endoscopy showing antral gastritis and a colonoscopy that revealed a large polyp in the descending colon approximately 50 cm.  This was removed by snare polypectomy.  He also had pan diverticulosis.  The patient had chronic atrial fibrillation and it was recommended that he be anticoagulated.  He apparently was discharged home and at the time of discharge apparently was started on Coumadin.  He is now on Coumadin 6 mg daily, just up to 7.5 mg daily, and is also receiving Lovenox. Yesterday he started passing bloody stools and was admitted with hematochezia. His systolic blood pressure dropped to 60.  It has come up now with some fluids.  MEDICATIONS:  1. Amiodarone 200 q.d.  2. Aspirin 325 q.d.  3. Atenolol 50 q.d.  4. Captopril 12.5 b.i.d.  5. Lanoxin 0.125 q.d.  6. Lasix 40 b.i.d.  7. Niacin 1000 mg q.h.s.  8. Protonix 40 mg q.d.  9. K-Dur 40 q.d. 10. Coumadin 7.5 mg q.d. 11. Lovenox 80 subcutaneously q.12h.  ALLERGIES:  None.  PAST MEDICAL HISTORY:  Patient has a history of congestive heart failure with atrial fibrillation/atrial flutter.  He has a dilated cardiomyopathy, chronic renal insufficiency, history of GI bleeding with diverticulosis identified on colonoscopy several years ago with recent colonoscopy revealing a 2  cm adenomatous polyp, history of hypertension, had a pneumothorax secondary to trauma, skull fracture secondary to trauma, previous hemorrhoidectomy.  FAMILY HISTORY:  Mother died of liver cancer.  Father died of coronary disease.  There is diabetes in the family.  No family history of colon cancer as far as he knows.  SOCIAL HISTORY:  He is a smoker.  Has two packs a day for 25 years.  He does drink.  He is a retired Special educational needs teacher.  PHYSICAL EXAMINATION  VITAL SIGNS:  Temperature 98.8, blood pressure 125/80 currently, pulse 76.  GENERAL:  Alert white male in no acute distress.  HEENT:  Eyes:  Sclerae:  Non-icteric.  NECK:  Supple.  No lymphadenopathy.  LUNGS:  Clear.  HEART:  Regular rate and rhythm without murmurs or gallops.  ABDOMEN:  Soft and nontender.  RECTAL:  Not repeated.  Gross bloody stool by the resident.  ASSESSMENT:  Acute hematochezia probably due to his previous polypectomy with anticoagulation.  I think at this point in time the greater risk to the patient clearly is bleeding.  RECOMMENDATION:  Would reverse his anticoagulation now as we are and I think this will probably resolve the bleeding issue.  We will then need to decide what to do with his anticoagulation for prophylaxis for embolic events due to his atrial flutter.  It may be that we will simply need to place him on low dose Lovenox for a week to 10 days prior to resuming  his Coumadin. Dictated by:   Llana Aliment. Randa Evens, M.D. Attending Physician:  Levy Sjogren DD:  12/21/01 TD:  12/23/01 Job: [id number]

## 2011-02-24 NOTE — Procedures (Signed)
. [facility]  Patient:    Rick Tyler, Rick Tyler Visit Number: [id number] MRN: [id number]          Service Type: MED Location: [phone] Attending Physician:  Phifer, Trinna Post Dictated by:   Everardo All. Madilyn Fireman, M.D. Proc. Date: 12/12/01 Admit Date:  12/06/2001   CC:         Lowella Bandy, M.D.   Procedure Report  PROCEDURE:  Colonoscopy with polypectomy.  INDICATION FOR PROCEDURE:  Heme-positive stool in a patient under consideration for chronic anticoagulation therapy.  DESCRIPTION OF PROCEDURE:  The patient was placed in the left lateral decubitus position and placed on a pulse monitor with continuous low-flow oxygen delivered by nasal cannula.  He was sedated with 50 mg IV Demerol and 6 mg IV Versed.  The Olympus video colonoscope was inserted into the rectum and advanced to the cecum, confirmed by transillumination at McBurneys point and visualization of the ileocecal valve and the appendiceal orifice.  The prep was excellent.  The cecum appeared normal.  There were diverticula seen beginning in the ascending colon and continuing throughout the colon down to the rectosigmoid junction.  The only other lesion seen was a large 2-2.5 cm pedunculated polyp in the descending colon at approximately 50 cm, and this was removed by snare.  The colonoscope was then withdrawn and the patient returned to the recovery room in stable condition.  He tolerated the procedure well, and there were no immediate complications.  IMPRESSION: 1. Large descending colon polyp. 2. Pancolonic diverticulosis.  PLAN:  I will await histology and proceed with EGD as scheduled. Dictated by:   Everardo All Madilyn Fireman, M.D. Attending Physician:  Phifer, Trinna Post DD:  12/12/01 TD:  12/13/01 Job: [id number]

## 2011-02-24 NOTE — Op Note (Signed)
Rick Tyler, Rick Tyler             ACCOUNT NO.:  [id number]   MEDICAL RECORD NO.:  [id number]          PATIENT TYPE:  INP   LOCATION:  2305                         FACILITY:  [facility]   PHYSICIAN:  Ines Bloomer, M.D. DATE OF BIRTH:  [birth year]   DATE OF PROCEDURE:  10/15/2008  DATE OF DISCHARGE:                               OPERATIVE REPORT   PREOPERATIVE DIAGNOSIS:  Massive hemoptysis.   POSTOPERATIVE DIAGNOSIS:  Massive hemoptysis.   OPERATION PERFORMED:  Right video-assisted thoracoscopy, right  thoracotomy, and right lower lobectomy with node dissection.   SURGEON:  Ines Bloomer, MD   FIRST ASSISTANT:  1. Evelene Croon, MD  2. Zadie Rhine, PA-C   ANESTHESIA:  General anesthesia.   After percutaneous insertion of all monitoring lines, the patient was  turned to right lateral thoracotomy position and was prepped and draped  in usual sterile manner.  Dual-lumen tube was inserted, the right lung  was deflated.  This patient had 5 episodes of fairly massive hemoptysis.  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  Two trocar sites were made in the anterior and posterior,  expanded to the eighth intercostal space and 2 trocars were inserted, 0  degree scope was inserted.  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  No interval lesion was seen.  For this  reason, we converted to a posterolateral thoracotomy dividing the  latissimus and reflecting the serratus anteriorly entering the fifth  intercostal space.  The intercostal muscles taken down for  electrocautery and reflected superiorly.  Portion of the sixth rib was  taken subpericostally at the angle.  Using the [facility] rib shears.  After dissecting that with an Community education officer.  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  The bronchial arteries were very large being at least  twice to 3 times the size they should be.  Inferior pulmonary ligament  was taken down the electrocautery.  Inferior pulmonary vein was  dissected out, stapled and divided with the autosuture with 2 mm  stapler, and then turned our attention to the fissure.  Before we turned  our attention to the fissure, we resected out the posterior hub taking  out some 7 nodes, then we went to the fissure and started a tedious  dissection around the fissure dissecting out first the inferior portion  of the fissure.  He had several inflammatory nodes and 11R nodes.  Inferior portion of the fissure was divided with the autosuture stapler  with several applications of 3.5 and the purple stapler.  Then, we  turned our attention to the superior portion of the fissure and like  manner stapled and divided it with the autosutures staplers and  dissected out several 10R nodes.  The superior segmental artery was  dissected out, stapled and divided with 2 mm autosuture  stapler.  Finally, we dissected out the basilar artery, stabled and divided with  the autosuture stapler.  Several more nodes were dissected free from the  bronchus and we identified the right middle lobe and then stapled with a  TA-30 stapler right below the base of the takeoff of the right lower  lobe and stapled it after we had expanded the lung to be sure, the  middle lobe and lower lobe expanded, and then divided with knife.  The  right lower lobe was sent down for frozen section of the bronchial  margin that was negative.  There appeared to be some irregular lesions  inside the bronchus.  All bleeding was electrocoagulated, the  intercostal muscle flap was then drop down to the bronchus and sutured  in place with 2-0 silk.  CoSeal was applied to the staple line.  A  Marcaine block was done in the usual  fashion.  A single On-Q was  inserted in usual fashion.  The chest was closed with 4 pericostal  drilling through the sixth rib and passed around the fifth rib.  Two 28  chest tubes were inserted through the trocar sites and tied in place  with 0 silk.  Chest was then closed in its layer with #1 Vicryl in the  muscle layer, 2-0 Vicryl in the subcutaneous tissue and Dermabond for  the skin.  The patient was returned recovery room in stable condition.      Ines Bloomer, M.D.  Electronically Signed     DPB/MEDQ  D:  10/15/2009  T:  10/16/2009  Job:  [id number]

## 2011-02-24 NOTE — Consult Note (Signed)
[place]. [facility]  Patient:    Rick Tyler, SINNING Visit Number: [id number] MRN: [id number]          Service Type: MED Location: [phone] Attending Physician:  Phifer, Trinna Post Dictated by:   Doylene Canning. Ladona Ridgel, M.D. [facility] Proc. Date: 12/11/01 Admit Date:  12/06/2001   CC:         Kathrine Cords  [facility]   Consultation Report  REASON FOR CONSULTATION:  Evaluation of atrial flutter.  REFERRING PHYSICIAN:  [name], M.D.  HISTORY OF PRESENT ILLNESS:  The patient is a very pleasant 70 year old man with a history of congestive heart failure, probably nonischemic, who was admitted to the hospital with progressive shortness of breath and lower extremity edema (biventricular heart failure).  He was subsequently found to be in atrial flutter with controlled ventricular response.  The patient states that for the last several weeks he has had increasing dyspnea and weight gain.  His cardiac history dates back to the June of 2001 or 2002 where the patient presented to the hospital in [place], [place] with congestive heart failure.  At that time, he was put on Coumadin and had recurrent bouts of congestive heart failure requiring repeat hospitalization. Catheterization by report showed no blockages but we do not have confirmation of this.  He was initially on Coumadin but apparently developed GI bleeding by his report and the Coumadin was discontinued.  In addition, he has weight loss associated with Coumadin therapy.  He has subsequently been admitted to the hospital and treated with IV diuretics and continued amiodarone and ACE inhibitors.  He is improved but still remains with congestive heart failure.  MEDICATIONS: 1. Atenolol 50 mg q.d. 2. Captopril 12.5 mg q.d. 3. Lasix 80 mg b.i.d. 4. Amiodarone 200 mg q.d. 5. Digoxin 0.125 mg q.d. 6. Potassium supplementation. 7. Aspirin.  FAMILY HISTORY:  Notable for a mother who died of  liver cancer at age 34. Father who died much younger.  SOCIAL HISTORY:  The patient smoked two packs a day for 25 years but quit 20 years ago.  He had a history of ethanol use but denies ethanol presently.  He is a retired Teacher, English as a foreign language.  PAST MEDICAL HISTORY:  Additional past medical history notable for a pneumothorax in 1960, occurred during trauma.  REVIEW OF SYSTEMS:  Negative for syncope or near syncope.  He denies palpitations.  He denies any urinary urgency or hesitancy.  He denies any nausea, vomiting, or constipation.  He does have a chronic cough associated with his heart failure.  He has a long history of edema.  PHYSICAL EXAMINATION:  GENERAL:  He is a pleasant, middle-aged man in no distress.  VITAL SIGNS:  Blood pressure was 148/92, pulse 80 and irregular, respirations 20 to 24.  HEENT:  Normocephalic and atraumatic.  Pupils equal and round.  Oropharynx is moist.  NECK:  Revealed 7 to 8 cm jugular venous distension.  The carotids were 1+ and symmetric.  Trachea was midline and there was no obvious thyromegaly.  CARDIOVASCULAR:  Irregular rhythm with a grade 1 to 2/6 systolic murmur at the apex.  LUNGS:  Clear bilaterally to auscultation.  ABDOMEN:  Soft, nontender, and nondistended.  There was no obvious organomegaly.  EXTREMITIES:  There was 2+ to 3+ peripheral edema bilaterally.  The pulses were 1+ and symmetric.  LABORATORY DATA:  EKG demonstrates atrial flutter with predominantly 2:1 and sometimes 3:1 AV conduction.  QRS duration is narrow.  IMPRESSION: 1. Congestive  heart failure. 2. Atrial flutter with controlled ventricular rate. 3. History of chronic Coumadin therapy, none since history of bleeding. 4. Probable dilated cardiomyopathy with no obvious ischemia. 5. Chronic renal insufficiency.  DISPOSITION:  I think there is still some chance that Mr. Mcbain could be improved with restoration of sinus rhythm.  Because of his difficulty with chronic  anticoagulation, I think catheter ablation gives [name] the best chance of restoring sinus rhythm and giving Coumadin if only a very brief period of time.  I know his GI workup is presently ongoing.  I would subsequently recommend that the patient, based on the results of his colonoscopy and endoscopy, undergo Coumadin therapy unless there was active bleeding.  We would consider then proceeding with TEE-guided cardioversion versus TEE-guided catheter ablation.  I will discuss this with Dr. [name]. Dictated by:   Doylene Canning. Ladona Ridgel, M.D. [facility] Attending Physician:  Phifer, Trinna Post DD:  12/11/01 TD:  12/12/01 Job: [id number]

## 2011-02-24 NOTE — Cardiovascular Report (Signed)
Rick Tyler, Rick Tyler             ACCOUNT NO.:  [id number]   MEDICAL RECORD NO.:  [id number]          PATIENT TYPE:  OIB   LOCATION:  2899                         FACILITY:  [facility]   PHYSICIAN:  [name], M.D.  DATE OF BIRTH:  1941-09-13   DATE OF PROCEDURE:  10/20/2005  DATE OF DISCHARGE:  10/20/2005                              CARDIAC CATHETERIZATION   PROCEDURES:  Right and left heart catheterization, selective coronary  angiography.   INDICATIONS:  This 70 year old black male had a history of dilated  cardiomyopathy, congestive heart failure, hypertension, exertional dyspnea  and recurrent chest pain.   APPROACH:  Right femoral artery using 4 French sheath and 7 French sheath in  right femoral vein.   COMPLICATIONS:  None.   HEMODYNAMIC DATA:  The left ventricle pressure was 132/12 and aortic  pressure was 133/94.  Right ventricular pressure was 30/3 and RA pressure  was 5/6.  Pulmonary artery pressure was 30/13.  Wedge pressure was 14/12.  Cardiac output was 4.3 L by Fick method and 3.6 L by thermodilution method,  and pulmonary vascular resistance was 186 to 222.  Less than 40 mL of dye  was used.   CORONARY ANATOMY:  1.  The left main coronary artery was short, atretic, with a distal 10-20      narrowing.  2.  Left anterior descending coronary artery:  The left anterior descending      coronary artery showed luminal irregularities and it wrapped around the      apex of the heart.  3.  The diagonal vessel showed a small diagonal 1 vessel and a slender      diagonal 2 vessel.  4.  Left circumflex coronary artery showed luminal irregularities.  The      ramus branch was small.  Obtuse marginal branch 1 was smaller than      obtuse marginal branch 2.  Obtuse marginal branch 2 had luminal      irregularities and obtuse marginal branch 3 was unremarkable.  5.  Right coronary artery:  The right coronary artery showed luminal      irregularities in its proximal  portion, 30% concentric midvessel      disease, and distal luminal irregularities.  The posterolateral branch      was unremarkable and posterior descending coronary artery had less than      20% proximal disease.   IMPRESSION:  1.  Mild multivessel native coronary artery disease.  2.  Moderate to severe left ventricular systolic dysfunction by      echocardiogram with an ejection fraction of 25%.  3.  Hypertension with low cardiac output.  4.  Normal right heart pressures.   RECOMMENDATIONS:  This patient will continue his medical therapy and  lifestyle modification.      [name], M.D.  Electronically Signed     ASK/MEDQ  D:  11/29/2005  T:  11/30/2005  Job:  [id number]

## 2011-02-24 NOTE — Discharge Summary (Signed)
[place]. [facility]  Patient:    Rick Tyler, Rick Tyler Visit Number: [id number] MRN: [id number]          Service Type: MED Location: [phone] Attending Physician:  Edwyna Perfect Dictated by:   Joslyn Devon, M.S. IV Admit Date:  12/21/2001 Discharge Date: 12/27/2001   CC:         Rick Tyler, M.D.   Discharge Summary  DISCHARGE DIAGNOSES:  1. Lower gastrointestinal bleed secondary to anticoagulation for a     transesophageal echocardiogram-guided cardioversion for atrial flutter.  2. Status post colonoscopy with snare polypectomy on December 12, 3001.  3. Status post sigmoidoscopy with clipping of polypectomy site to control     bleeding on December 21, 2001.  4. Congestive heart failure.  5. Dilated cardiomyopathy.  6. Mitral regurgitation/tricuspid regurgitation.  7. CRI.  DISCHARGE MEDICATIONS:  1. Lasix 40 mg p.o. q.d.  2. Atenolol 50 mg p.o. q.d.  3. Captopril 12.5 mg p.o. b.i.d.  4. Digoxin 0.125 mg p.o. q.d.  5. Amiodarone 200 mg p.o. q.d.  7. Niacin 1000 mg p.o. q.h.s.  8. Protonix 40 mg p.o. q.d.  9. K-Dur 40 mEq p.o. q.d. 10. Holding aspirin 325 mg p.o. q.d. until reevaluation of lower GI bleed. 11. No Coumadin. 12. No Lovenox. 13. Lactulose 15 cc for four days to soften stool.  CONSULTANTS:  1. Rick Tyler, M.D., cardiology.  2. Llana Aliment. Randa Evens, M.D., gastroenterology.  PRIMARY CARE PHYSICIAN: This patient is referred to [facility] outpatient clinic.  PROCEDURES: Sigmoidoscopy on December 21, 2001, polypectomy site with bleeding ulceration.  Endoclips were placed at the base and top with good control of bleeding.  HISTORY OF PRESENT ILLNESS: The patient is a 70 year old African-American male, with acute GI bleed.  He was discharged from [facility] on December 18, 2001 after a two week hospitalization for a CHF exacerbation.  During the hospitalization the patient had a TEE-guided cardioversion for  atrial flutter.  A GI evaluation was done for consideration of chronic anticoagulation post cardioversion.  An EGD and a colonoscopy were done in that GI evaluation.  The EGD revealed antral gastritis with no focal areas of ulceration.  The colonoscopy revealed a 2 cm polyp, which was snared, and pandiverticulosis.  Post GI evaluation the decision was made to start the patient on chronic anticoagulation with Coumadin and Lovenox bridge.  The patients Coumadin was titrated by pharmacy slowly with an intended goal of 2.0-3.0 to prevent thromboembolic event.  The patient thus far has no noted thrombus during the TEE.  At the time of discharge the patients INR was 1.3. The patient was instructed to follow up closely in the Coumadin clinic for titration of the Coumadin, and until the Coumadin was therapeutic he was to continue administering Lovenox.  On Thursday, December 19, 2001, the patients Coumadin was increased to 7.5 mg q.d., and he continued on Lovenox until his INR reached a goal of 2.0-3.0.  On December 20, 2001 the patient was noted to have small streaks of blood in his stool.  On December 21, 2001 the patient awoke with sheets bloodstained.  The patient presented to the emergency department with positive dizziness, disorientation, numbness, but no chest pain.  On admission the patients hemoglobin dropped from 12.1 to 6.8.  His INR was 2.2.  His blood pressure lying was 105/66 and standing was 62/40.  The EKG was noted for a new left bundle branch block.  The physical  examination was performed on admission by the B2 medicine service.  The information is not available at the time of this dictation.  Other significant laboratories are his negative cardiac enzymes.  Set one equalled a CK of 86, CK-MB of 1.5, troponin of 0.09.  Set two is a CK of 64, a CK-MB of 1.1, a troponin of 0.05. Set three had a CK of 68, CK-MB of 1.7, and troponin of 0.04.  HOSPITAL COURSE: PROBLEM #1 - LOWER  GASTROINTESTINAL BLEED: The hemoglobin was 6.8 on admission with an INR of 2.2.  The patient was vigorously hydrated with IV fluids, received a total of 6 units of FFP, and 6 units of PRBCs.  An emergent sigmoidoscopy was performed with clipping of the polypectomy site.  Bleeding was controlled post procedure.  The hemoglobin stabilized at 9.9 on December 22, 2001 post transfusion of the 6 units of PRBCs.  The INR continued to trend downward to 1.1 after the 6 units of FFP.  There was one episode of recurrent bleeding with stooling on December 23, 2001.  The hemoglobin maintained between 9-10 with no additional transfusions.  On discharge the patients hemoglobin was 8.8, platelets had not changed throughout hospitalization and maintained in the 200,000s, and INR was 1.1.  The patient had no recurrent episodes of blood with stooling.  He was started on Lactulose at 15 cc q.d. for a total of five days to soften his stool.  The patient was stable on discharge with no episodes of bleeding.  His hemoglobin will be followed as an outpatient.  PROBLEM #2 - CONGESTIVE HEART FAILURE: Initially Atenolol/Captopril/Lasix was held until his blood pressure was stabilized.  His medicines were slowly titrated to the doses prior to admission.  At discharge the patient had no shortness of breath, no lower extremity edema, and stable blood pressures.  PROBLEM #3 - RULE OUT MYOCARDIAL INFARCTION: The patient presented to the emergency department with a new left bundle branch block.  The cardiac enzymes were negative for ischemia x3.  Post transfusion and rehydration the patients rhythm returned to normal sinus and was maintained.  The patient was continued on amiodarone at 200 mg p.o. q.d. for his history of atrial flutter.  No Coumadin, no Lovenox, no aspirin was resumed.  DISCHARGE INSTRUCTIONS: Monitor closely for recurrent episodes of GI bleed. It is possible that the patient may have bouts of bleeding  secondary to  loosening of the clips at the polypectomy site.  The patient is instructed carefully to return to the emergency department or to the [facility] outpatient clinic at the first sight of any stool streaked with blood.  At that time the patients hemoglobin and symptoms will have to be closely monitored.  As an outpatient the patient will have to be reevaluated before starting Coumadin.  The benefits of preventing thromboembolism in the setting of cardioverted A flutter to normal sinus rhythm will have to be weighed against the risk of significant GI bleeding again.  It is possible that after the mucosa has healed from the polypectomy site that the patient may be started on Coumadin again.  At this time aspirin was discontinued.  The patient will need to be reevaluated before restarting it, also.  FOLLOW-UP: Follow-up is going to be with Dr. Algie Coffer on January 01, 2002 at 1:30 p.m., and the patient will follow up in the outpatient clinic on December 31, 2001 at 1:30 p.m.   Dictated by:   Joslyn Devon, M.S. IV Attending Physician:  Aundria Rud,  Armanda Heritage DD:  12/31/01 TD:  01/01/02 Job: [id number]

## 2011-03-09 ENCOUNTER — Inpatient Hospital Stay (HOSPITAL_COMMUNITY)
Admission: AD | Admit: 2011-03-09 | Discharge: 2011-03-13 | DRG: 683 | Disposition: A | Discharge status: Home / Self Care | Payer: Medicare Other | Source: Ambulatory Visit | Attending: Cardiovascular Disease | Admitting: Cardiovascular Disease

## 2011-03-09 ENCOUNTER — Inpatient Hospital Stay (HOSPITAL_COMMUNITY): Payer: Medicare Other

## 2011-03-09 DIAGNOSIS — T502X5A Adverse effect of carbonic-anhydrase inhibitors, benzothiadiazides and other diuretics, initial encounter: Secondary | ICD-10-CM | POA: Diagnosis present

## 2011-03-09 DIAGNOSIS — J449 Chronic obstructive pulmonary disease, unspecified: Secondary | ICD-10-CM | POA: Diagnosis present

## 2011-03-09 DIAGNOSIS — I428 Other cardiomyopathies: Secondary | ICD-10-CM | POA: Diagnosis present

## 2011-03-09 DIAGNOSIS — E785 Hyperlipidemia, unspecified: Secondary | ICD-10-CM | POA: Diagnosis present

## 2011-03-09 DIAGNOSIS — I129 Hypertensive chronic kidney disease with stage 1 through stage 4 chronic kidney disease, or unspecified chronic kidney disease: Secondary | ICD-10-CM | POA: Diagnosis present

## 2011-03-09 DIAGNOSIS — I509 Heart failure, unspecified: Secondary | ICD-10-CM | POA: Diagnosis present

## 2011-03-09 DIAGNOSIS — J4489 Other specified chronic obstructive pulmonary disease: Secondary | ICD-10-CM | POA: Diagnosis present

## 2011-03-09 DIAGNOSIS — N184 Chronic kidney disease, stage 4 (severe): Secondary | ICD-10-CM | POA: Diagnosis present

## 2011-03-09 DIAGNOSIS — N179 Acute kidney failure, unspecified: Principal | ICD-10-CM | POA: Diagnosis present

## 2011-03-09 DIAGNOSIS — I5022 Chronic systolic (congestive) heart failure: Secondary | ICD-10-CM | POA: Diagnosis present

## 2011-03-09 DIAGNOSIS — Z87891 Personal history of nicotine dependence: Secondary | ICD-10-CM

## 2011-03-09 DIAGNOSIS — I4891 Unspecified atrial fibrillation: Secondary | ICD-10-CM | POA: Diagnosis present

## 2011-03-09 LAB — COMPREHENSIVE METABOLIC PANEL
ALT: 67 U/L — ABNORMAL HIGH (ref 0–53)
AST: 58 U/L — ABNORMAL HIGH (ref 0–37)
Albumin: 3.8 g/dL (ref 3.5–5.2)
CO2: 28 mEq/L (ref 19–32)
Calcium: 9.7 mg/dL (ref 8.4–10.5)
Chloride: 95 mEq/L — ABNORMAL LOW (ref 96–112)
GFR calc Af Amer: 12 mL/min — ABNORMAL LOW (ref >60)
GFR calc non Af Amer: 10 mL/min — ABNORMAL LOW (ref >60)
Sodium: 137 mEq/L (ref 135–145)

## 2011-03-09 LAB — DIFFERENTIAL
Basophils Relative: 1 % (ref 0–1)
Eosinophils Absolute: 0.1 10*3/uL (ref 0.0–0.7)
Monocytes Absolute: 0.5 10*3/uL (ref 0.1–1.0)
Monocytes Relative: 10 % (ref 3–12)

## 2011-03-09 LAB — CBC
MCH: 29.4 pg (ref 26.0–34.0)
MCHC: 34.6 g/dL (ref 30.0–36.0)
Platelets: 154 10*3/uL (ref 150–400)

## 2011-03-10 ENCOUNTER — Inpatient Hospital Stay (HOSPITAL_COMMUNITY): Payer: Medicare Other

## 2011-03-10 LAB — URINALYSIS, MICROSCOPIC ONLY
Bilirubin Urine: NEGATIVE
Leukocytes, UA: NEGATIVE
Nitrite: NEGATIVE
Specific Gravity, Urine: 1.017 (ref 1.005–1.030)
Urobilinogen, UA: 0.2 mg/dL (ref 0.0–1.0)
pH: 5 (ref 5.0–8.0)

## 2011-03-10 LAB — BASIC METABOLIC PANEL
CO2: 29 mEq/L (ref 19–32)
Chloride: 95 mEq/L — ABNORMAL LOW (ref 96–112)
Creatinine, Ser: 5.17 mg/dL — ABNORMAL HIGH (ref 0.4–1.5)
GFR calc Af Amer: 13 mL/min — ABNORMAL LOW (ref >60)
Potassium: 3.7 mEq/L (ref 3.5–5.1)

## 2011-03-10 LAB — URIC ACID: Uric Acid, Serum: 16 mg/dL — ABNORMAL HIGH (ref 4.0–7.8)

## 2011-03-11 LAB — COMPREHENSIVE METABOLIC PANEL
ALT: 56 U/L — ABNORMAL HIGH (ref 0–53)
AST: 45 U/L — ABNORMAL HIGH (ref 0–37)
Albumin: 3.4 g/dL — ABNORMAL LOW (ref 3.5–5.2)
Calcium: 9.3 mg/dL (ref 8.4–10.5)
Creatinine, Ser: 3.86 mg/dL — ABNORMAL HIGH (ref 0.4–1.5)
GFR calc Af Amer: 19 mL/min — ABNORMAL LOW (ref >60)
Sodium: 139 mEq/L (ref 135–145)
Total Protein: 6.7 g/dL (ref 6.0–8.3)

## 2011-03-12 LAB — BASIC METABOLIC PANEL
CO2: 31 mEq/L (ref 19–32)
Calcium: 9.7 mg/dL (ref 8.4–10.5)
Chloride: 102 mEq/L (ref 96–112)
Creatinine, Ser: 3.36 mg/dL — ABNORMAL HIGH (ref 0.4–1.5)
Glucose, Bld: 101 mg/dL — ABNORMAL HIGH (ref 70–99)

## 2011-03-12 LAB — CBC
HCT: 38.4 % — ABNORMAL LOW (ref 39.0–52.0)
Hemoglobin: 12.9 g/dL — ABNORMAL LOW (ref 13.0–17.0)
MCH: 29.4 pg (ref 26.0–34.0)
MCHC: 33.6 g/dL (ref 30.0–36.0)
MCV: 87.5 fL (ref 78.0–100.0)
Platelets: 145 10*3/uL — ABNORMAL LOW (ref 150–400)
RBC: 4.39 MIL/uL (ref 4.22–5.81)
RDW: 14 % (ref 11.5–15.5)
WBC: 4.8 10*3/uL (ref 4.0–10.5)

## 2011-03-12 LAB — DIGOXIN LEVEL: Digoxin Level: 2 ng/mL (ref 0.8–2.0)

## 2011-03-13 LAB — BASIC METABOLIC PANEL
CO2: 27 mEq/L (ref 19–32)
Calcium: 9.6 mg/dL (ref 8.4–10.5)
Chloride: 101 mEq/L (ref 96–112)
Creatinine, Ser: 2.73 mg/dL — ABNORMAL HIGH (ref 0.4–1.5)
GFR calc Af Amer: 28 mL/min — ABNORMAL LOW (ref >60)
Glucose, Bld: 94 mg/dL (ref 70–99)
Sodium: 139 mEq/L (ref 135–145)

## 2011-03-13 LAB — PTH, INTACT AND CALCIUM: Calcium, Total (PTH): 9.8 mg/dL (ref 8.4–10.5)

## 2011-03-15 LAB — PROTEIN ELECTROPH W RFLX QUANT IMMUNOGLOBULINS
Albumin ELP: 55.8 % (ref 55.8–66.1)
Alpha-1-Globulin: 5.5 % — ABNORMAL HIGH (ref 2.9–4.9)
Alpha-2-Globulin: 10.4 % (ref 7.1–11.8)
Gamma Globulin: 17.8 % (ref 11.1–18.8)

## 2011-03-15 NOTE — Discharge Summary (Signed)
[name], [name]:  [id number]  MEDICAL RECORD NO.:  [id number]  LOCATION:  4703                         FACILITY:  [facility]  PHYSICIAN:  Rick Tyler, M.D.  DATE OF BIRTH:  Apr 08, 1941  DATE OF ADMISSION:  03/09/2011 DATE OF DISCHARGE:  03/13/2011                              DISCHARGE SUMMARY   FINAL DIAGNOSES: 1. Acute renal insufficiency secondary to volume depletion and over     diuresis. 2. Cardiomyopathy. 3. Chronic atrial flutter. 4. Hypertension. 5. Hyperlipidemia. 6. Chronic obstructive lung disease.  DISCHARGE MEDICATIONS: 1. Isosorbide dinitrate with hydralazine (BiDil 1 tablet twice daily). 2. Carvedilol 25 mg half a tablet twice daily. 3. Lanoxin 0.125 mg 1 tablet on Mondays. 4. Lasix 80 mg 1 tablet on Monday, Wednesday, Friday as needed. 5. Lipitor 10 mg 1 tablet in the evening. 6. Amiodarone 200 mg 1 tablet daily. 7. Nitroglycerin 0.4 mg sublingual 1 under the tongue every 5 minutes     x3 as needed. 8. Pantoprazole 40 mg daily. 9. Multivitamin daily. 10.Vitamin D3 1000 units daily. 11.The patient to discontinue metolazone and diltiazem.  DISCHARGE DIET:  Low-sodium heart-healthy diet with 1500 mL fluid restriction per day.  DISCHARGE ACTIVITY:  The patient to increase activity gradually as tolerated.  FOLLOWUP:  By Dr. Orpah Tyler in 1 week.  The patient to call [phone] for appointment.  CONDITION ON DISCHARGE:  Improved.  HISTORY:  This 70 year old black male with a history of chronic kidney disease presented for markedly increased serum creatinine along with feeling weak and dizzy.  PHYSICAL EXAMINATION:  VITAL SIGNS:  Pulse of 30-40, respiration of 19, blood pressure 98/56. HEENT:  The patient is normocephalic, atraumatic with conjunctivae pink, sclerae nonicteric. NECK:  No JVD. LUNGS:  Clear bilaterally. HEART:  Normal S1, S2. ABDOMEN:  Soft and nontender. EXTREMITIES:  No edema. SKIN:  Mild darkening of  the face, over chest and back area consistent with tinea versicolor.  LABORATORY DATA:  Normal hemoglobin/hematocrit, WBC count, platelet count, normal electrolytes.  BUN 149, creatinine 5.49, magnesium 2.8, B natriuretic peptide 1677.  Thyroid stimulating hormone 0.518, digoxin level elevated at 2.7, uric acid elevated at 16.  BMET subsequently showed BUN 147, creatinine 5.17.  On June 3, BUN was down to 93 with a creatinine of 3.36.  On the day of discharge, BUN was 184, creatinine was 2.73, digoxin level was down to 2.0.  EKG showed atrial flutter fibrillation with diffuse ST-T abnormality.  HOSPITAL COURSE:  The patient was admitted to telemetry unit with suspicion of dehydration.  IV fluids were started.  The patient's condition showed slow improvement.  Renal consult of Dr. Briant Tyler was obtained.  After 72-96 hours, the patient's BUN and creatinine came down to acceptable level of 2.73.  His B natriuretic peptide was down to 86 and digoxin level was down to 2.0.  He was discharged home in satisfactory condition with followup by me in 1 week.  His Coreg dose was reduced by 50% and Cardizem was discontinued.     Rick Tyler, M.D.     ASK/MEDQ  D:  03/13/2011  T:  03/13/2011  Job:  [id number]  Electronically Signed by Rick Tyler M.D.  on 03/15/2011 08:30:45 AM

## 2011-03-16 LAB — IGG, IGA, IGM
IgA: 302 mg/dL (ref 68–379)
IgG (Immunoglobin G), Serum: 1310 mg/dL (ref 650–1600)
IgM, Serum: 156 mg/dL (ref 41–251)

## 2011-03-16 LAB — IMMUNOFIXATION ADD-ON

## 2011-03-28 NOTE — Consult Note (Signed)
NAME:  Rick Tyler, Rick Tyler             ACCOUNT NO.:  [id number]  MEDICAL RECORD NO.:  [id number]           PATIENT TYPE:  I  LOCATION:  4703                         FACILITY:  [facility]  PHYSICIAN:  Dyke Maes, M.D.DATE OF BIRTH:  10-Jul-1941  DATE OF CONSULTATION:  03/10/2011 DATE OF DISCHARGE:                                CONSULTATION   REFERRING PHYSICIAN:  [name], MD  REASON:  Acute-on-chronic renal failure.  HISTORY OF PRESENT ILLNESS:  This is a 70 year old black male with a history of chronic kidney disease stage III/IV who was presumably admitted for abnormal labs, i.e. increased serum creatinine. Fortunately, there is no admit note from me to refer to or dictation in the computer.  The patient denies any gross hematuria, kidney stones, obstructive-type symptoms, use of nonsteroidal medications or change in urine output.  He admits to having high blood pressure for at least 10 years, but he says he has been under good control.  He recently did have an increase in his Lasix and addition of metolazone to control some peripheral edema, which has improved.  His p.o. intake has been good and he denies any overt uremic symptoms.  Renal ultrasound done today shows right kidney of 9.7 cm, left kidney of 10.1 cm and was otherwise unremarkable.  Regards to the patient's renal history, serum creatinine in 2003 was 1.1.  In January 2011, it was 1.69 and at that time, his urinalysis showed 30 mg/dL protein and 16-10 rbc's and 3-6 wbc's.  In October 2011, his serum creatinine was 2.8 and November 2011, they ranged between 2.1-2.4.  On admission, his serum creatinine is 5.4 and today it is 5.1 with a markedly elevated BUN of 147.  His urine output has been 1000 mL already today.  He denies any systemic-type symptoms. He is active golfer.  PAST MEDICAL HISTORY:  Significant for: 1. Congestive heart failure with an EF of 25%. 2. History of chronic atrial fib. 3.  Hypertension. 4. 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  ALLERGIES:  He says COUMADIN caused excessive bleeding.  MEDICATIONS PRIOR TO ADMISSION: 1. BiDil 1 p.o. b.i.d. 2. Diltiazem 360 mg a day. 3. Coreg 25 mg b.i.d. 4. Vitamin D 3000 units daily. 5. Protonix 40 mg a day. 6. Digoxin 0.125 mg a day. 7. Lipitor 10 mg a day. 8. Zaroxolyn 2.5 mg a day. 9. Lasix 80 mg b.i.d., though at least that was listed on his     medication, however, he has been taking 120 mg daily. 10.Amiodarone 200 mg a day.  SOCIAL HISTORY:  He is an ex-smoker, quit in 38s.  He was a 2-pack per day smoker for 25 years.  He drinks occasional beer.  He lives with his half-sister here in [place].  FAMILY HISTORY:  Mother died of pancreatic cancer.  Father, per the past medical record say that he died of coronary disease, although the patient says he has no idea what his father died from.  REVIEW OF SYSTEMS:  Appetite is good.  Energy level good.  He denies any chest pains or chest pressures.  Denies any shortness of  breath, PND, orthopnea.  No recent change in bowel habits.  His edema has resolved. Complains of mild pain in his left lower back when he leans to his right.  He has a chronic skin rash which he had some cream 4 years ago and used it, then it resolved.  PHYSICAL EXAMINATION:  VITAL SIGNS:  Blood pressure 118/69.  He is not orthostatic.  Pulse 61, temperature 97.9. GENERAL:  Healthy-appearing 70 year old black male in no acute stress. HEENT:  Sclera nonicteric.  His muscles are intact. NECK:  No JVD. LUNGS:  Clear to auscultation. HEART:  Irregularly irregular without murmur, rub, or gallop. ABDOMEN:  Positive bowel sounds, nontender, nondistended.  No hepatosplenomegaly. EXTREMITIES:  No clubbing, cyanosis, or edema.  Pulses are 2/4 in the carotids, radials, femorals, 104 in dorsalis pedis and posterior tibial. NEUROLOGIC:  Cranial nerves intact.  Motor and sensory  intact.  No asterixis.  LABORATORY DATA:  Sodium 136, potassium 3.7, bicarb 29, BUN 147, creatinine 5.1.  Dig 2.7.  AST 50, ALT 67, hemoglobin 13.6, white count 4.7.  IMPRESSION: 1. Acute on chronic kidney disease, stage III/IV, probably secondary     to over diuresis.  He has never had evaluation for his chronic     kidney disease that I can tell and thus will need workup.  The only     thing his history that would coincide with chronic kidney disease     would be hypertension. 2. Cardiomyopathy. 3. Chronic atrial fibrillation.  PLAN:  We will check urinalysis.  We will check a serum protein electrophoresis, check an intact PTH. Would hold his digoxin for now.  I would hold his diltiazem and Coreg for systolic blood pressure is less than 110.  Avoid contrast studies and nonsteroidal medications.  I agree with the IV fluids for now.  Further evaluation will depend on what his urinalysis looks like.  Thank you very much for consult.  We will follow the patient with you.          ______________________________ Dyke Maes, M.D.    MTM/MEDQ  D:  03/10/2011  T:  03/11/2011  Job:  [id number]  Electronically Signed by Primitivo Gauze M.D. on 03/28/2011 08:04:07 PM

## 2011-07-26 ENCOUNTER — Other Ambulatory Visit: Payer: Self-pay | Admitting: Thoracic Surgery

## 2011-07-26 DIAGNOSIS — C3430 Malignant neoplasm of lower lobe, unspecified bronchus or lung: Secondary | ICD-10-CM

## 2011-08-13 IMAGING — CR DG CHEST 2V
2 series · 2 of 2 positions shown · non-contrast
Comparison: CT chest of 10/13/2010 and portable chest x-ray of
08/22/2010

CLINICAL DATA: Short of breath, history of lung carcinoma, follow-
up

CHEST - 2 VIEW

[w chest pa]
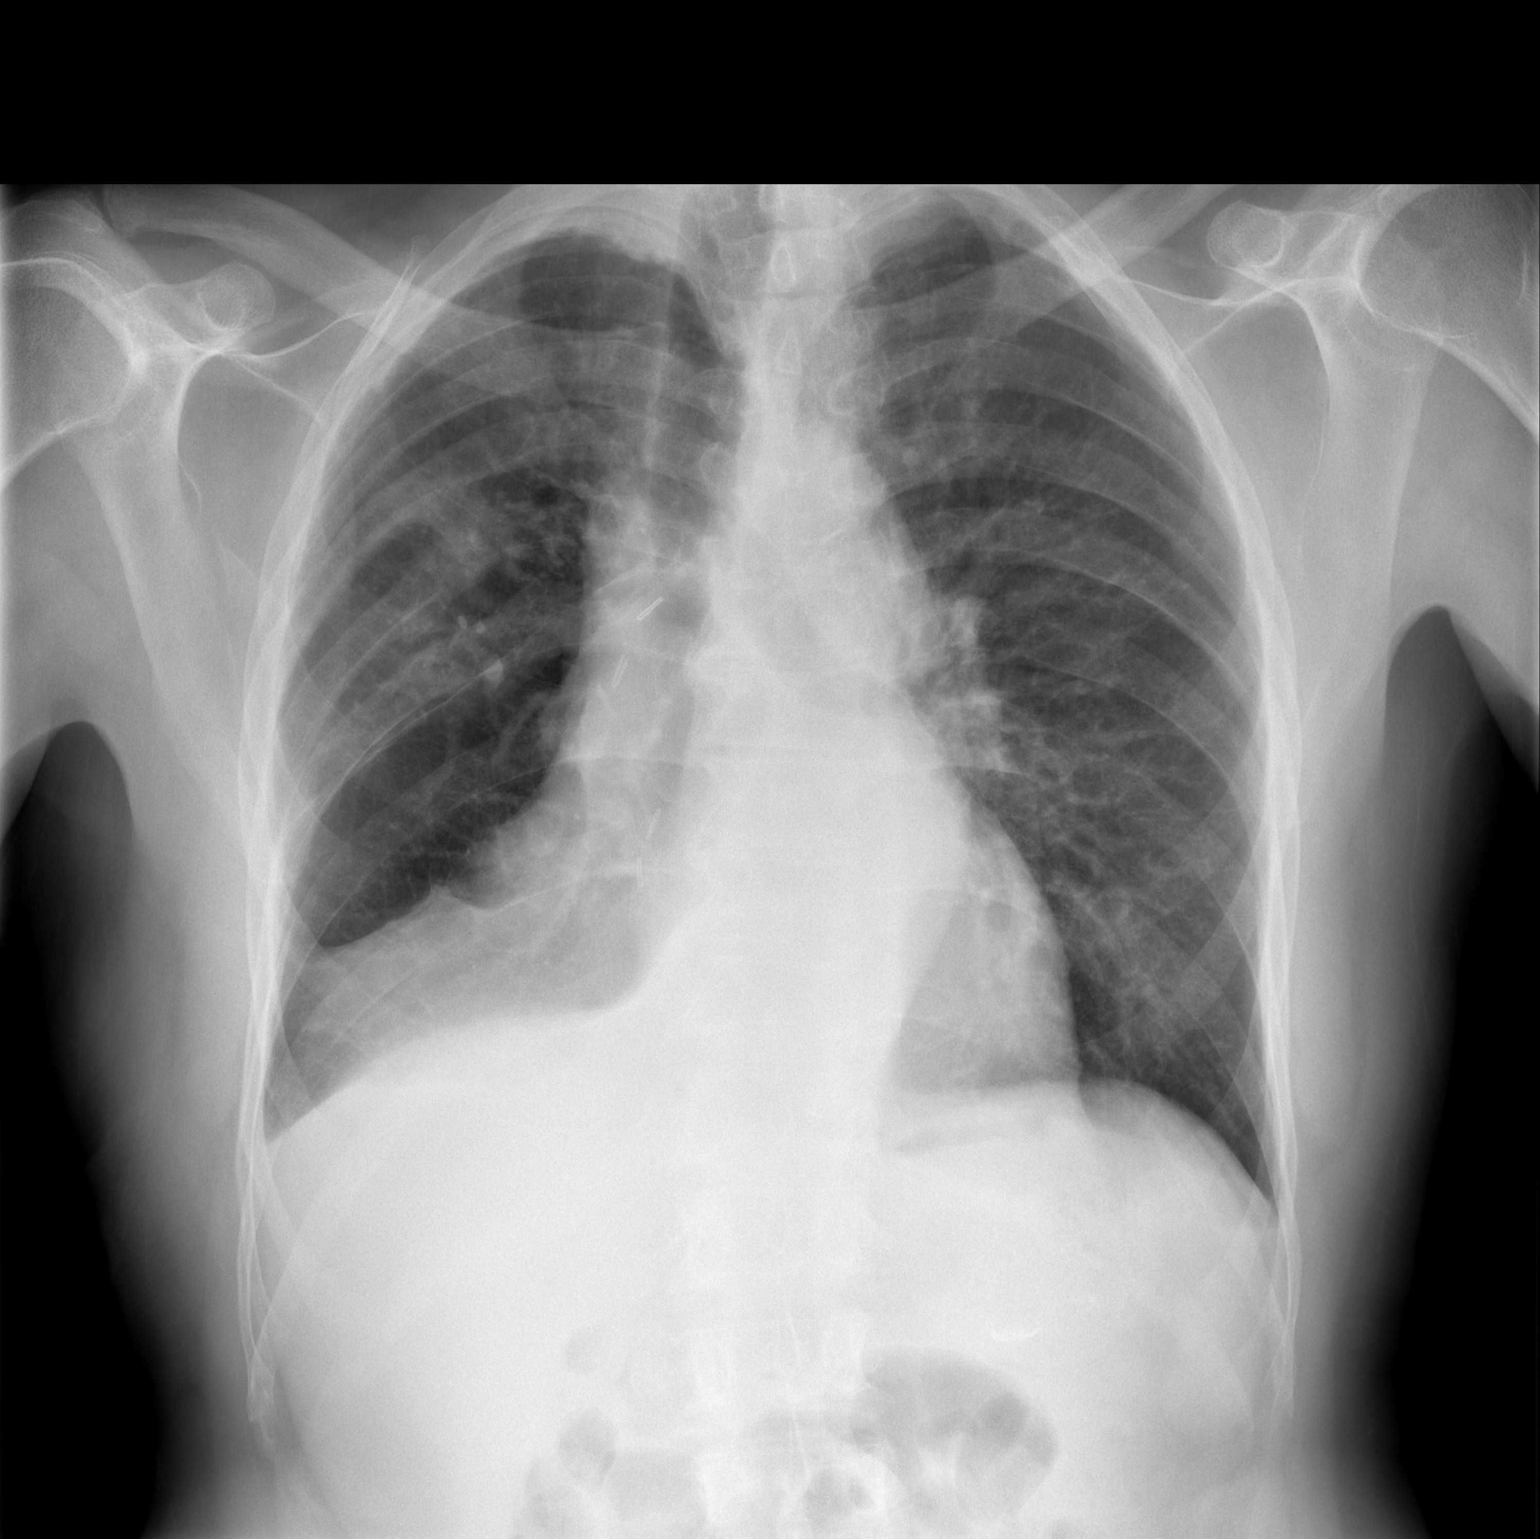

[w chest lat]
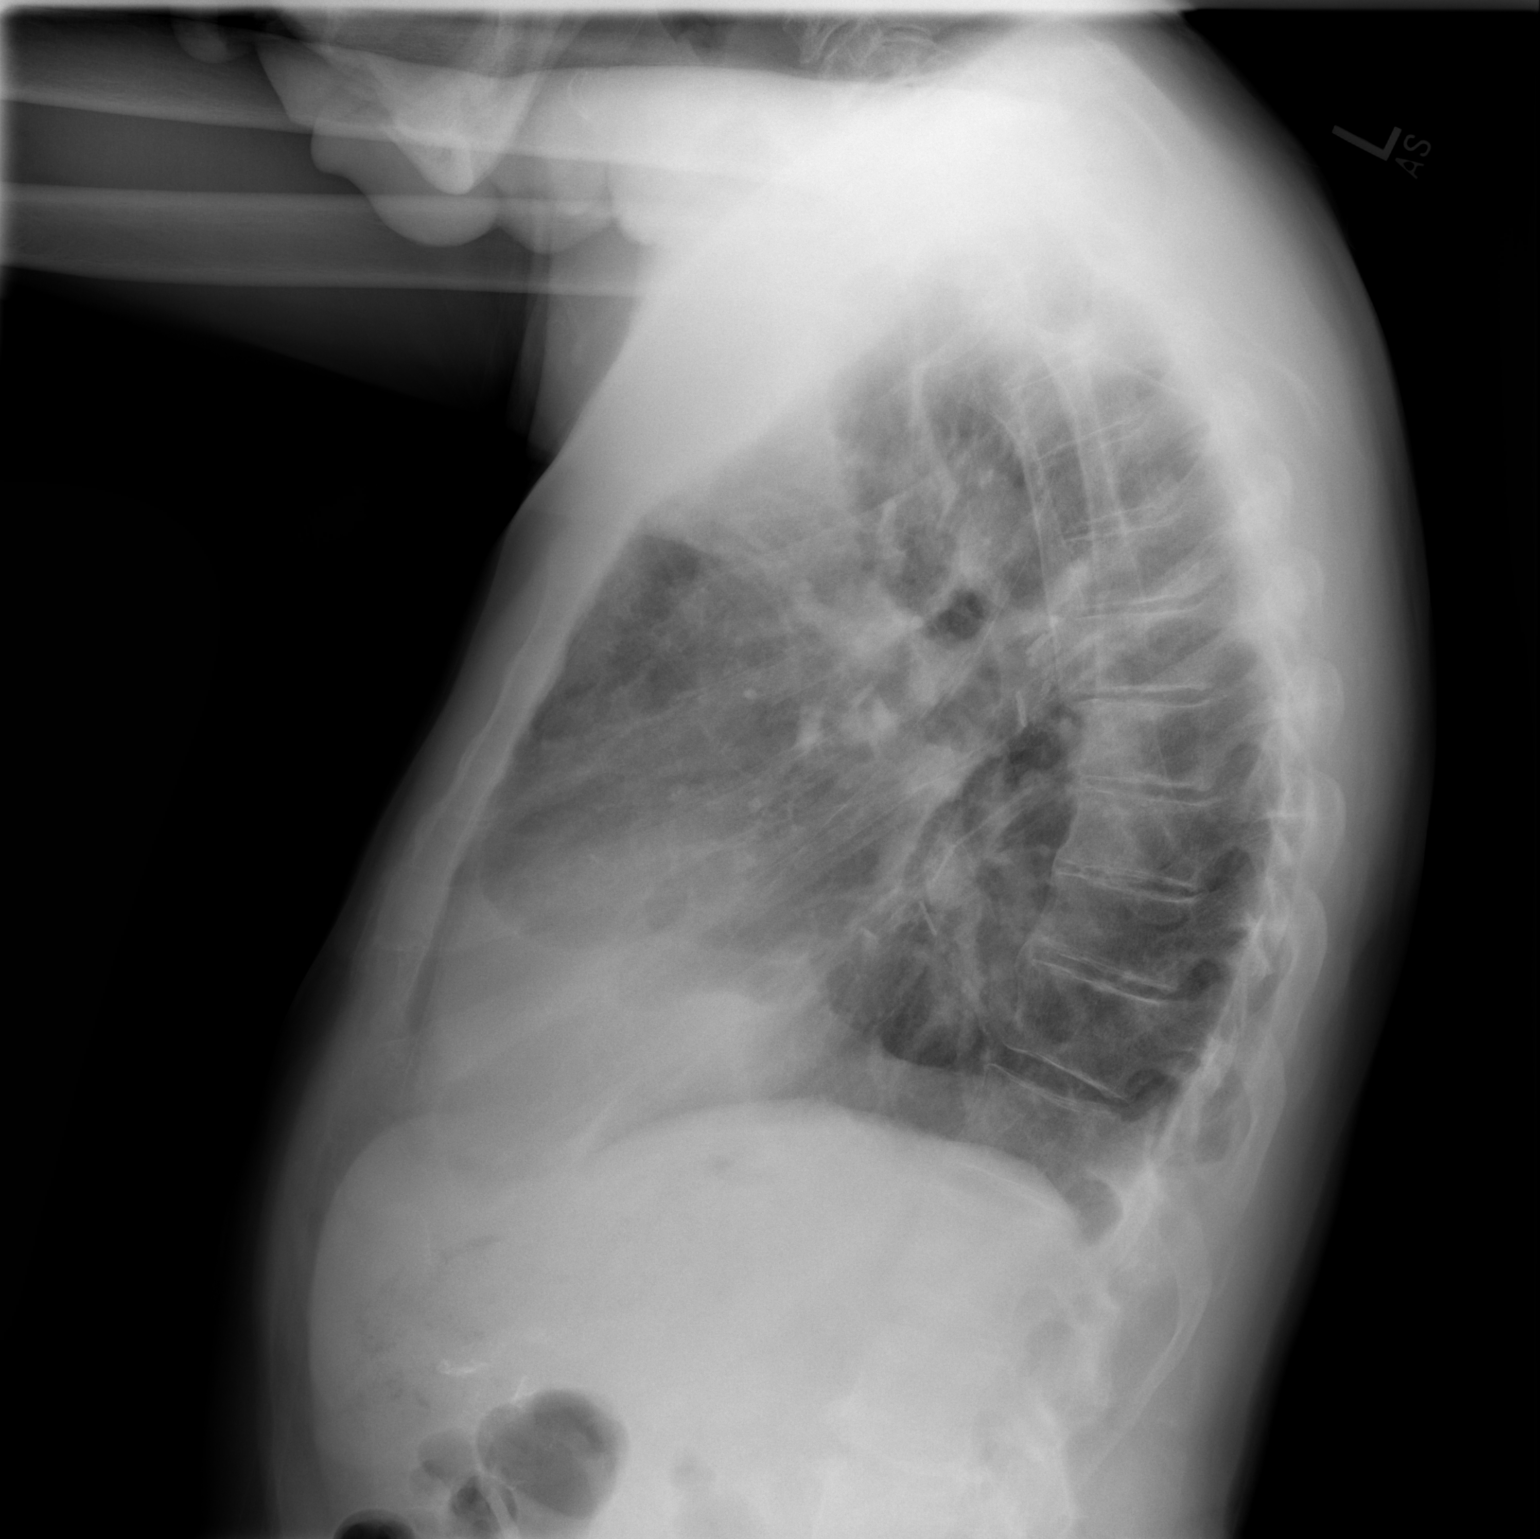

[2 of 2 positions shown; findings below may reference images not displayed]

FINDINGS: Pleural and parenchymal opacity at the right lung base is
stable with some elevation of the right hemidiaphragm.  No active
process is seen.  Mediastinal contours appear stable.  The heart is
within upper limits of normal and stable.  Synchondrosis of the
posterior right sixth and seventh ribs again is noted.
IMPRESSION: Stable pleural and parenchymal changes at the right lung base.  No
definite active process.

## 2011-08-14 ENCOUNTER — Encounter: Payer: Self-pay | Admitting: Thoracic Surgery

## 2011-08-14 DIAGNOSIS — R042 Hemoptysis: Secondary | ICD-10-CM | POA: Insufficient documentation

## 2011-08-14 DIAGNOSIS — N289 Disorder of kidney and ureter, unspecified: Secondary | ICD-10-CM | POA: Insufficient documentation

## 2011-08-23 ENCOUNTER — Other Ambulatory Visit: Payer: Medicare Other

## 2011-08-23 ENCOUNTER — Ambulatory Visit: Payer: Medicare Other | Admitting: Thoracic Surgery

## 2011-09-06 ENCOUNTER — Ambulatory Visit: Payer: Medicare Other | Admitting: Thoracic Surgery

## 2011-09-06 ENCOUNTER — Other Ambulatory Visit: Payer: Medicare Other

## 2011-09-20 ENCOUNTER — Ambulatory Visit: Payer: Medicare Other | Admitting: Thoracic Surgery

## 2011-09-20 ENCOUNTER — Ambulatory Visit
Admission: RE | Admit: 2011-09-20 | Discharge: 2011-09-20 | Disposition: A | Discharge status: Home / Self Care | Payer: Medicare Other | Source: Ambulatory Visit | Attending: Thoracic Surgery | Admitting: Thoracic Surgery

## 2011-09-20 ENCOUNTER — Inpatient Hospital Stay: Admission: RE | Admit: 2011-09-20 | Payer: Medicare Other | Source: Ambulatory Visit

## 2011-09-20 DIAGNOSIS — C3430 Malignant neoplasm of lower lobe, unspecified bronchus or lung: Secondary | ICD-10-CM

## 2011-09-26 ENCOUNTER — Ambulatory Visit (INDEPENDENT_AMBULATORY_CARE_PROVIDER_SITE_OTHER): Payer: Medicare Other | Admitting: Thoracic Surgery

## 2011-09-26 ENCOUNTER — Encounter: Payer: Self-pay | Admitting: Thoracic Surgery

## 2011-09-26 VITALS — BP 148/83 | HR 68 | Resp 20 | Ht 72.0 in | Wt 176.0 lb

## 2011-09-26 DIAGNOSIS — D0220 Carcinoma in situ of unspecified bronchus and lung: Secondary | ICD-10-CM

## 2011-09-26 NOTE — Progress Notes (Signed)
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 He's had no hemoptysis. He's doing well overall. CT scan showed no evidence of recurrence of his cancer. I will call worse for him back to his medical Dr. for long-term followup. His lungs were clear to auscultation percussion.  Current Outpatient Prescriptions  Medication Sig Dispense Refill  . amiodarone (PACERONE) 200 MG tablet Take 200 mg by mouth daily.        Marland Kitchen atorvastatin (LIPITOR) 10 MG tablet Take 10 mg by mouth daily.        . carvedilol (COREG) 12.5 MG tablet Take 12.5 mg by mouth 2 (two) times daily with a meal.        . digoxin (LANOXIN) 0.125 MG tablet Take 125 mcg by mouth daily.        . furosemide (LASIX) 80 MG tablet Take 80 mg by mouth 2 (two) times daily.        . isosorbide-hydrALAZINE (BIDIL) 20-37.5 MG per tablet Take 1 tablet by mouth 3 (three) times daily.        . nitroGLYCERIN (NITROSTAT) 0.4 MG SL tablet Place 0.4 mg under the tongue every 5 (five) minutes as needed.        . potassium chloride (K-DUR,KLOR-CON) 10 MEQ tablet Take 10 mEq by mouth 2 (two) times daily.           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

## 2012-07-16 ENCOUNTER — Ambulatory Visit
Admission: RE | Admit: 2012-07-16 | Discharge: 2012-07-16 | Disposition: A | Discharge status: Home / Self Care | Payer: Medicare Other | Source: Ambulatory Visit | Attending: Cardiovascular Disease | Admitting: Cardiovascular Disease

## 2012-07-16 ENCOUNTER — Other Ambulatory Visit: Payer: Self-pay | Admitting: Cardiovascular Disease

## 2012-07-16 DIAGNOSIS — M25562 Pain in left knee: Secondary | ICD-10-CM

## 2012-10-18 ENCOUNTER — Other Ambulatory Visit (HOSPITAL_COMMUNITY): Payer: Self-pay | Admitting: Cardiovascular Disease

## 2012-10-18 DIAGNOSIS — R079 Chest pain, unspecified: Secondary | ICD-10-CM

## 2012-11-08 ENCOUNTER — Other Ambulatory Visit: Payer: Self-pay

## 2012-11-08 ENCOUNTER — Encounter (HOSPITAL_COMMUNITY)
Admission: RE | Admit: 2012-11-08 | Discharge: 2012-11-08 | Disposition: A | Discharge status: Home / Self Care | Payer: Medicare Other | Source: Ambulatory Visit | Attending: Cardiovascular Disease | Admitting: Cardiovascular Disease

## 2012-11-08 DIAGNOSIS — R079 Chest pain, unspecified: Secondary | ICD-10-CM

## 2012-11-08 DIAGNOSIS — C3490 Malignant neoplasm of unspecified part of unspecified bronchus or lung: Secondary | ICD-10-CM | POA: Insufficient documentation

## 2012-11-08 DIAGNOSIS — I10 Essential (primary) hypertension: Secondary | ICD-10-CM | POA: Insufficient documentation

## 2012-11-08 MED ORDER — TECHNETIUM TC 99M SESTAMIBI GENERIC - CARDIOLITE
9.0000 | Freq: Once | INTRAVENOUS | Status: AC | PRN
  Administered 2012-11-08: 9 via INTRAVENOUS

## 2012-11-08 MED ORDER — REGADENOSON 0.4 MG/5ML IV SOLN
INTRAVENOUS | Status: AC
  Administered 2012-11-08: 0.4 mg via INTRAVENOUS
  Filled 2012-11-08: qty 5

## 2012-11-08 MED ORDER — TECHNETIUM TC 99M SESTAMIBI GENERIC - CARDIOLITE
30.0000 | Freq: Once | INTRAVENOUS | Status: AC | PRN
  Administered 2012-11-08: 30 via INTRAVENOUS

## 2012-11-08 MED ORDER — REGADENOSON 0.4 MG/5ML IV SOLN
0.4000 mg | Freq: Once | INTRAVENOUS | Status: AC
  Administered 2012-11-08: 0.4 mg via INTRAVENOUS

## 2013-06-24 ENCOUNTER — Other Ambulatory Visit: Payer: Self-pay | Admitting: Family Medicine

## 2013-06-24 ENCOUNTER — Ambulatory Visit
Admission: RE | Admit: 2013-06-24 | Discharge: 2013-06-24 | Disposition: A | Discharge status: Home / Self Care | Payer: Medicare Other | Source: Ambulatory Visit | Attending: Family Medicine | Admitting: Family Medicine

## 2013-06-24 DIAGNOSIS — M549 Dorsalgia, unspecified: Secondary | ICD-10-CM

## 2013-07-08 ENCOUNTER — Encounter (HOSPITAL_COMMUNITY): Payer: Self-pay | Admitting: Emergency Medicine

## 2013-07-08 ENCOUNTER — Observation Stay (HOSPITAL_COMMUNITY)
Admission: EM | Admit: 2013-07-08 | Discharge: 2013-07-09 | Discharge status: Against Medical Advice | Payer: Medicare Other | Attending: Cardiovascular Disease | Admitting: Cardiovascular Disease

## 2013-07-08 ENCOUNTER — Emergency Department (HOSPITAL_COMMUNITY): Payer: Medicare Other

## 2013-07-08 ENCOUNTER — Observation Stay (HOSPITAL_COMMUNITY): Payer: Medicare Other

## 2013-07-08 DIAGNOSIS — N184 Chronic kidney disease, stage 4 (severe): Secondary | ICD-10-CM | POA: Insufficient documentation

## 2013-07-08 DIAGNOSIS — I2510 Atherosclerotic heart disease of native coronary artery without angina pectoris: Secondary | ICD-10-CM | POA: Insufficient documentation

## 2013-07-08 DIAGNOSIS — T462X5A Adverse effect of other antidysrhythmic drugs, initial encounter: Secondary | ICD-10-CM | POA: Insufficient documentation

## 2013-07-08 DIAGNOSIS — I129 Hypertensive chronic kidney disease with stage 1 through stage 4 chronic kidney disease, or unspecified chronic kidney disease: Secondary | ICD-10-CM | POA: Insufficient documentation

## 2013-07-08 DIAGNOSIS — J4489 Other specified chronic obstructive pulmonary disease: Secondary | ICD-10-CM | POA: Insufficient documentation

## 2013-07-08 DIAGNOSIS — J449 Chronic obstructive pulmonary disease, unspecified: Secondary | ICD-10-CM | POA: Insufficient documentation

## 2013-07-08 DIAGNOSIS — I079 Rheumatic tricuspid valve disease, unspecified: Secondary | ICD-10-CM | POA: Insufficient documentation

## 2013-07-08 DIAGNOSIS — I509 Heart failure, unspecified: Secondary | ICD-10-CM

## 2013-07-08 DIAGNOSIS — R55 Syncope and collapse: Principal | ICD-10-CM | POA: Insufficient documentation

## 2013-07-08 DIAGNOSIS — I059 Rheumatic mitral valve disease, unspecified: Secondary | ICD-10-CM | POA: Insufficient documentation

## 2013-07-08 DIAGNOSIS — I5020 Unspecified systolic (congestive) heart failure: Secondary | ICD-10-CM | POA: Insufficient documentation

## 2013-07-08 DIAGNOSIS — R9431 Abnormal electrocardiogram [ECG] [EKG]: Secondary | ICD-10-CM | POA: Insufficient documentation

## 2013-07-08 DIAGNOSIS — R0789 Other chest pain: Secondary | ICD-10-CM | POA: Insufficient documentation

## 2013-07-08 HISTORY — DX: Heart failure, unspecified: I50.9

## 2013-07-08 HISTORY — DX: Malignant (primary) neoplasm, unspecified: C80.1

## 2013-07-08 LAB — CBC
HCT: 33.2 % — ABNORMAL LOW (ref 39.0–52.0)
MCHC: 34.3 g/dL (ref 30.0–36.0)
Platelets: 157 10*3/uL (ref 150–400)
RDW: 14 % (ref 11.5–15.5)

## 2013-07-08 LAB — URINALYSIS, ROUTINE W REFLEX MICROSCOPIC
Bilirubin Urine: NEGATIVE
Leukocytes, UA: NEGATIVE
Nitrite: NEGATIVE
Specific Gravity, Urine: 1.01 (ref 1.005–1.030)
Urobilinogen, UA: 0.2 mg/dL (ref 0.0–1.0)

## 2013-07-08 LAB — POCT I-STAT TROPONIN I: Troponin i, poc: 0 ng/mL (ref 0.00–0.08)

## 2013-07-08 LAB — BASIC METABOLIC PANEL
BUN: 44 mg/dL — ABNORMAL HIGH (ref 6–23)
GFR calc Af Amer: 24 mL/min — ABNORMAL LOW (ref >90)
GFR calc non Af Amer: 20 mL/min — ABNORMAL LOW (ref >90)
Potassium: 3.6 mEq/L (ref 3.5–5.1)
Sodium: 138 mEq/L (ref 135–145)

## 2013-07-08 MED ORDER — SODIUM CHLORIDE 0.9 % IV SOLN: INTRAVENOUS | Status: DC

## 2013-07-08 MED ORDER — NITROGLYCERIN 0.4 MG SL SUBL: 0.4000 mg | SUBLINGUAL_TABLET | SUBLINGUAL | Status: DC | PRN

## 2013-07-08 MED ORDER — AMIODARONE HCL 100 MG PO TABS
100.0000 mg | ORAL_TABLET | Freq: Every day | ORAL | Status: DC
  Administered 2013-07-09: 10:00:00 100 mg via ORAL
  Filled 2013-07-08: qty 1

## 2013-07-08 MED ORDER — ASPIRIN 81 MG PO CHEW
324.0000 mg | CHEWABLE_TABLET | ORAL | Status: DC
  Filled 2013-07-08: qty 4

## 2013-07-08 MED ORDER — NITROGLYCERIN 0.4 MG SL SUBL
0.4000 mg | SUBLINGUAL_TABLET | SUBLINGUAL | Status: DC | PRN
  Administered 2013-07-08: 0.4 mg via SUBLINGUAL
  Filled 2013-07-08: qty 25

## 2013-07-08 MED ORDER — ACETAMINOPHEN 325 MG PO TABS: 650.0000 mg | ORAL_TABLET | ORAL | Status: DC | PRN

## 2013-07-08 MED ORDER — CARVEDILOL 12.5 MG PO TABS
12.5000 mg | ORAL_TABLET | Freq: Two times a day (BID) | ORAL | Status: DC
  Administered 2013-07-08 – 2013-07-09 (×2): 12.5 mg via ORAL
  Filled 2013-07-08 (×3): qty 1

## 2013-07-08 MED ORDER — HEPARIN SODIUM (PORCINE) 5000 UNIT/ML IJ SOLN
5000.0000 [IU] | Freq: Three times a day (TID) | INTRAMUSCULAR | Status: DC
  Filled 2013-07-08 (×5): qty 1

## 2013-07-08 MED ORDER — ISOSORB DINITRATE-HYDRALAZINE 20-37.5 MG PO TABS
1.0000 | ORAL_TABLET | Freq: Three times a day (TID) | ORAL | Status: DC
  Administered 2013-07-08 – 2013-07-09 (×2): 1 via ORAL
  Filled 2013-07-08 (×4): qty 1

## 2013-07-08 MED ORDER — ASPIRIN EC 81 MG PO TBEC
81.0000 mg | DELAYED_RELEASE_TABLET | Freq: Every day | ORAL | Status: DC
  Administered 2013-07-09: 10:00:00 81 mg via ORAL
  Filled 2013-07-08: qty 1

## 2013-07-08 MED ORDER — ASPIRIN 300 MG RE SUPP
300.0000 mg | RECTAL | Status: DC
  Filled 2013-07-08: qty 1

## 2013-07-08 MED ORDER — FUROSEMIDE 80 MG PO TABS
80.0000 mg | ORAL_TABLET | Freq: Two times a day (BID) | ORAL | Status: DC
  Administered 2013-07-08 – 2013-07-09 (×2): 80 mg via ORAL
  Filled 2013-07-08 (×4): qty 1

## 2013-07-08 MED ORDER — ATORVASTATIN CALCIUM 10 MG PO TABS
10.0000 mg | ORAL_TABLET | Freq: Every day | ORAL | Status: DC
  Administered 2013-07-09: 10:00:00 10 mg via ORAL
  Filled 2013-07-08: qty 1

## 2013-07-08 NOTE — H&P (Signed)
Rick Tyler is an 72 y.o. male.   Chief Complaint: Atypical chest pain/status post syncope HPI: Patient is 72 year old male with past rectal history significant for hypertension, history of congestive heart failure secondary to systolic dysfunction, history of A. 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 Patient denies any palpitations prior to syncopal episode. Denies any seizure activity. Denies any weakness in the arms or legs. Denies any slurred speech or blurring of vision. While in the ER patient developed sharp chest pain lasting less than a minute without associated symptoms. Patient does give history of exertional dyspnea but states his activity now is limited and wants to be treated medically only. Refusing for any further evaluation for valvular heart disease. Patient denies any PND orthopnea but states occasionally gets leg swelling and takes extra dose of Lasix.  Past Medical History  Diagnosis Date  . Hemoptysis   . Renal insufficiency   . HTN (hypertension)     Past Surgical History  Procedure Laterality Date  . R vats  10/15/2008  . Bronchoscopy  10/11/2009    History reviewed. No pertinent family history. Social History:  reports that he has never smoked. He does not have any smokeless tobacco history on file. He reports that  drinks alcohol. He reports that he does not use illicit drugs.  Allergies:  Allergies  Allergen Reactions  . Warfarin Sodium     REACTION: Free Bleeder     (Not in a hospital admission)  Results for orders placed during the hospital encounter of 07/08/13 (from the past 48 hour(s))  CBC     Status:  Abnormal   Collection Time    07/08/13  3:52 PM      Result Value Range   WBC 4.3  4.0 - 10.5 K/uL   RBC 3.72 (*) 4.22 - 5.81 MIL/uL   Hemoglobin 11.4 (*) 13.0 - 17.0 g/dL   HCT 40.9 (*) 81.1 - 91.4 %   MCV 89.2  78.0 - 100.0 fL   MCH 30.6  26.0 - 34.0 pg   MCHC 34.3  30.0 - 36.0 g/dL   RDW 78.2  95.6 - 21.3 %   Platelets 157  150 - 400 K/uL  BASIC METABOLIC PANEL     Status: Abnormal   Collection Time    07/08/13  3:52 PM      Result Value Range   Sodium 138  135 - 145 mEq/L   Potassium 3.6  3.5 - 5.1 mEq/L   Chloride 103  96 - 112 mEq/L   CO2 22  19 - 32 mEq/L   Glucose, Bld 84  70 - 99 mg/dL   BUN 44 (*) 6 - 23 mg/dL   Creatinine, Ser 0.86 (*) 0.50 - 1.35 mg/dL   Calcium 9.2  8.4 - 57.8 mg/dL   GFR calc non Af Amer 20 (*) >90 mL/min   GFR calc Af Amer 24 (*) >90 mL/min   Comment: (NOTE)     The eGFR has been calculated using the CKD EPI equation.     This calculation has not been validated in all clinical situations.     eGFR's persistently <90 mL/min signify possible Chronic Kidney  Disease.  PRO B NATRIURETIC PEPTIDE     Status: Abnormal   Collection Time    07/08/13  3:52 PM      Result Value Range   Pro B Natriuretic peptide (BNP) 907.6 (*) 0 - 125 pg/mL  POCT I-STAT TROPONIN I     Status: None   Collection Time    07/08/13  4:26 PM      Result Value Range   Troponin i, poc 0.00  0.00 - 0.08 ng/mL   Comment 3            Comment: Due to the release kinetics of cTnI,     a negative result within the first hours     of the onset of symptoms does not rule out     myocardial infarction with certainty.     If myocardial infarction is still suspected,     repeat the test at appropriate intervals.  URINALYSIS, ROUTINE W REFLEX MICROSCOPIC     Status: None   Collection Time    07/08/13  4:43 PM      Result Value Range   Color, Urine YELLOW  YELLOW   APPearance CLEAR  CLEAR   Specific Gravity, Urine 1.010  1.005 - 1.030   pH 5.5  5.0 - 8.0   Glucose, UA  NEGATIVE  NEGATIVE mg/dL   Hgb urine dipstick NEGATIVE  NEGATIVE   Bilirubin Urine NEGATIVE  NEGATIVE   Ketones, ur NEGATIVE  NEGATIVE mg/dL   Protein, ur NEGATIVE  NEGATIVE mg/dL   Urobilinogen, UA 0.2  0.0 - 1.0 mg/dL   Nitrite NEGATIVE  NEGATIVE   Leukocytes, UA NEGATIVE  NEGATIVE   Comment: MICROSCOPIC NOT DONE ON URINES WITH NEGATIVE PROTEIN, BLOOD, LEUKOCYTES, NITRITE, OR GLUCOSE <1000 mg/dL.   Dg Chest 2 View  07/08/2013   CLINICAL DATA:  Loss of consciousness, history of renal insufficiency  EXAM: CHEST  2 VIEW  COMPARISON:  03/09/2011  FINDINGS: Cardiomediastinal silhouette is stable. Surgical clips in right hilum again noted. Stable chronic elevation of the right hemidiaphragm and blunting of the right costophrenic angle. Mild degenerative changes thoracic spine again noted. No acute infiltrate or pulmonary edema.  IMPRESSION: No active cardiopulmonary disease. Stable chronic elevation of the right hemidiaphragm and blunting of the right costophrenic angle.   Electronically Signed   By: Natasha Mead   On: 07/08/2013 16:36    Review of Systems  Constitutional: Negative for fever and chills.  HENT: Negative for hearing loss.   Eyes: Negative for blurred vision and double vision.  Respiratory: Negative for cough, hemoptysis, sputum production and shortness of breath.   Cardiovascular: Positive for chest pain. Negative for palpitations, orthopnea and claudication.  Gastrointestinal: Negative for nausea, vomiting, abdominal pain and diarrhea.  Genitourinary: Negative for dysuria.  Neurological: Positive for dizziness and loss of consciousness. Negative for focal weakness, seizures and headaches.    Blood pressure 154/91, pulse 71, temperature 98.1 F (36.7 C), temperature source Oral, resp. rate 21, height 6\' 1"  (1.854 m), weight 83.008 kg (183 lb), SpO2 99.00%. Physical Exam  Constitutional: He is oriented to person, place, and time.  HENT:  Head: Normocephalic and atraumatic.   Eyes: Conjunctivae are normal. Pupils are equal, round, and reactive to light. Left eye exhibits no discharge. No scleral icterus.  Neck: Normal range of motion. Neck supple. No JVD present. No tracheal deviation present. No thyromegaly present.  Cardiovascular:  Regular rate and rhythm 3/6 systolic murmur noted at Radiating to axilla and also  had lower left sternal border and soft diastolic murmur and S3 gallop noted  Respiratory: Effort normal and breath sounds normal. No respiratory distress. He has no wheezes. He has no rales.  GI: Soft. Bowel sounds are normal. He exhibits no distension. There is no tenderness. There is no rebound.  Musculoskeletal: He exhibits no edema.  Neurological: He is alert and oriented to person, place, and time.     Assessment/Plan Status post syncope rule out cardiac arrhythmias Atypical chest pain Mild coronary artery disease Mild congestive heart failure secondary to systolic dysfunction Valvular heart disease with severe MR and TR Uncontrolled hypertension Chronic kidney disease stage IV History of A. fib flutter in the past COPD Elevated QTC secondary to amiodarone Plan Patient agrees to stay in the hospital for next 24 hours we'll check serial enzymes and monitor on telemetry for 24 hours. If stable will DC home in a.m. Continue home meds  Reduce amiodarone dose as per orders  Oshua Mcconaha N 07/08/2013, 6:46 PM

## 2013-07-08 NOTE — ED Provider Notes (Signed)
CSN: [id number]     Arrival date & time 07/08/13  1510 History   First MD Initiated Contact with Patient 07/08/13 1513     Chief Complaint  Patient presents with  . Loss of Consciousness   (Consider location/radiation/quality/duration/timing/severity/associated sxs/prior Treatment) Patient is a 72 y.o. male presenting with syncope.  Loss of Consciousness Episode history:  Single Most recent episode:  Today Duration:  4 minutes Timing:  Constant Progression:  Resolved Chronicity:  Recurrent Context: sitting down   Witnessed: yes   Relieved by:  Lying down Worsened by:  Nothing tried Ineffective treatments:  None tried Associated symptoms: no anxiety, no chest pain, no difficulty breathing, no dizziness, no fever, no focal weakness, no headaches, no nausea, no seizures, no shortness of breath, no vomiting and no weakness     Past Medical History  Diagnosis Date  . Hemoptysis   . Renal insufficiency   . HTN (hypertension)    Past Surgical History  Procedure Laterality Date  . R vats  10/15/2008  . Bronchoscopy  10/11/2009   History reviewed. No pertinent family history. History  Substance Use Topics  . Smoking status: Never Smoker   . Smokeless tobacco: Not on file  . Alcohol Use: Yes     Comment: not on a regular basis    Review of Systems  Constitutional: Negative for fever and chills.  HENT: Negative for congestion, sore throat and rhinorrhea.   Eyes: Negative for photophobia and visual disturbance.  Respiratory: Negative for cough and shortness of breath.   Cardiovascular: Positive for syncope. Negative for chest pain and leg swelling.  Gastrointestinal: Negative for nausea, vomiting, abdominal pain, diarrhea and constipation.  Endocrine: Negative for polydipsia and polyuria.  Genitourinary: Negative for dysuria and hematuria.  Musculoskeletal: Negative for back pain and arthralgias.  Skin: Negative for color change and rash.  Neurological: Negative for  dizziness, focal weakness, seizures, syncope, weakness, light-headedness and headaches.  Hematological: Negative for adenopathy. Does not bruise/bleed easily.  All other systems reviewed and are negative.    Allergies  Warfarin sodium  Home Medications   Current Outpatient Rx  Name  Route  Sig  Dispense  Refill  . amiodarone (PACERONE) 200 MG tablet   Oral   Take 200 mg by mouth daily.          Marland Kitchen atorvastatin (LIPITOR) 10 MG tablet   Oral   Take 10 mg by mouth daily.          . carvedilol (COREG) 12.5 MG tablet   Oral   Take 12.5 mg by mouth 2 (two) times daily with a meal.          . furosemide (LASIX) 80 MG tablet   Oral   Take 80 mg by mouth 2 (two) times daily.          . isosorbide-hydrALAZINE (BIDIL) 20-37.5 MG per tablet   Oral   Take 1 tablet by mouth 3 (three) times daily.          . nitroGLYCERIN (NITROSTAT) 0.4 MG SL tablet   Sublingual   Place 0.4 mg under the tongue every 5 (five) minutes as needed.           BP 156/82  Pulse 70  Temp(Src) 98.1 F (36.7 C) (Oral)  Resp 20  Ht 6\' 1"  (1.854 m)  Wt 183 lb (83.008 kg)  BMI 24.15 kg/m2  SpO2 100% Physical Exam  Vitals reviewed. Constitutional: He is oriented to person, place, and time. He appears  well-developed and well-nourished.  HENT:  Head: Normocephalic and atraumatic.  Eyes: Conjunctivae and EOM are normal.  Neck: Normal range of motion. Neck supple.  Cardiovascular: Normal rate, regular rhythm and normal heart sounds.   Pulmonary/Chest: Effort normal and breath sounds normal. No respiratory distress.  Abdominal: He exhibits no distension. There is no tenderness. There is no rebound and no guarding.  Musculoskeletal: Normal range of motion.  Neurological: He is alert and oriented to person, place, and time. He has normal strength. No cranial nerve deficit or sensory deficit. GCS eye subscore is 4. GCS verbal subscore is 5. GCS motor subscore is 6.  Skin: Skin is warm and dry.     ED Course  Procedures (including critical care time) Labs Review Labs Reviewed  CBC - Abnormal; Notable for the following:    RBC 3.72 (*)    Hemoglobin 11.4 (*)    HCT 33.2 (*)    All other components within normal limits  BASIC METABOLIC PANEL - Abnormal; Notable for the following:    BUN 44 (*)    Creatinine, Ser 2.85 (*)    GFR calc non Af Amer 20 (*)    GFR calc Af Amer 24 (*)    All other components within normal limits  PRO B NATRIURETIC PEPTIDE - Abnormal; Notable for the following:    Pro B Natriuretic peptide (BNP) 907.6 (*)    All other components within normal limits  URINALYSIS, ROUTINE W REFLEX MICROSCOPIC  POCT I-STAT TROPONIN I   Imaging Review Dg Chest 2 View  07/08/2013   CLINICAL DATA:  Loss of consciousness, history of renal insufficiency  EXAM: CHEST  2 VIEW  COMPARISON:  03/09/2011  FINDINGS: Cardiomediastinal silhouette is stable. Surgical clips in right hilum again noted. Stable chronic elevation of the right hemidiaphragm and blunting of the right costophrenic angle. Mild degenerative changes thoracic spine again noted. No acute infiltrate or pulmonary edema.  IMPRESSION: No active cardiopulmonary disease. Stable chronic elevation of the right hemidiaphragm and blunting of the right costophrenic angle.   Electronically Signed   By: Natasha Mead   On: 07/08/2013 16:36      Date: 07/08/2013  Rate: 64  Rhythm: normal sinus rhythm  QRS Axis: normal  Intervals: QT prolonged  ST/T Wave abnormalities: normal  Conduction Disutrbances:none  Narrative Interpretation: NSR with prolonged QT  Old EKG Reviewed: changes noted   MDM   1. Syncope   2. CHF (congestive heart failure)    72 y.o. male  with pertinent PMH of CHF, CKD, HTN presents with sycnopal episode, with total LOC 30 seconds, however with 4 minutes decreased responsiveness.  No chest pain reported, however pt did have some antecedent lightheadedness, nausea.  He was able to be lowered to the  ground without difficulty and returned to normal spontaneously.  He denies recent fevers, gi or respiratory symptoms.  Physical exam and vitals on arrival as above, unremarkable.  Labs unremarkable as well.  Spoke with Dr. Sharyn Lull and pt admitted to medicine. ECG with prolonged qt .    Labs and imaging as above reviewed by myself and attending,Dr. Rosalia Hammers, with whom case was discussed.   1. Syncope   2. CHF (congestive heart failure)         Noel Gerold, MD 07/08/13 1610

## 2013-07-08 NOTE — ED Notes (Signed)
Pt to ED via EMS for syncopal episode that lasted for 2-3 minutes. Pt states he was sitting outside on a chair getting hair cut. Pt denies fall. Cardiac Hx. BP-106/82 (initial) then BP-124/78 (after normal saline bolus), SpO2-100% room air. Pt states he felt hot prior to passing out.

## 2013-07-08 NOTE — ED Notes (Signed)
Admitting at bedside 

## 2013-07-08 NOTE — ED Provider Notes (Signed)
  I performed a history and physical examination of Rick Tyler and discussed his management with Dr. Littie Deeds.  I agree with the history, physical, assessment, and plan of care, with the following exceptions: None  I was present for the following procedures: None Time Spent in Critical Care of the patient: None Time spent in discussions with the patient and family: 61  72 y.o. Male with episode of loc possibly secondary to hypoglycemia. Otherwise with normal work up except prolonged qt on ekg.  Plan admission for further evaluation and tretment.    Holli Humbles, MD 07/08/13 1910

## 2013-07-09 LAB — COMPREHENSIVE METABOLIC PANEL
Albumin: 3.4 g/dL — ABNORMAL LOW (ref 3.5–5.2)
Alkaline Phosphatase: 54 U/L (ref 39–117)
BUN: 40 mg/dL — ABNORMAL HIGH (ref 6–23)
Creatinine, Ser: 2.64 mg/dL — ABNORMAL HIGH (ref 0.50–1.35)
GFR calc Af Amer: 26 mL/min — ABNORMAL LOW (ref >90)
Potassium: 3.6 mEq/L (ref 3.5–5.1)
Total Protein: 6.5 g/dL (ref 6.0–8.3)

## 2013-07-09 LAB — CBC WITH DIFFERENTIAL/PLATELET
Basophils Absolute: 0 10*3/uL (ref 0.0–0.1)
Basophils Relative: 1 % (ref 0–1)
Eosinophils Relative: 3 % (ref 0–5)
HCT: 32.3 % — ABNORMAL LOW (ref 39.0–52.0)
MCH: 30.8 pg (ref 26.0–34.0)
MCHC: 34.7 g/dL (ref 30.0–36.0)
Monocytes Absolute: 0.7 10*3/uL (ref 0.1–1.0)
Neutro Abs: 2.5 10*3/uL (ref 1.7–7.7)
Neutrophils Relative %: 54 % (ref 43–77)
Platelets: 136 10*3/uL — ABNORMAL LOW (ref 150–400)
RDW: 14 % (ref 11.5–15.5)

## 2013-07-09 LAB — TSH: TSH: 0.797 u[IU]/mL (ref 0.350–4.500)

## 2013-07-09 LAB — PROTIME-INR
INR: 1.09 (ref 0.00–1.49)
Prothrombin Time: 13.9 seconds (ref 11.6–15.2)

## 2013-07-09 LAB — APTT: aPTT: 27 seconds (ref 24–37)

## 2013-07-09 LAB — MAGNESIUM: Magnesium: 2.2 mg/dL (ref 1.5–2.5)

## 2013-07-09 LAB — TROPONIN I: Troponin I: <0.3 ng/mL (ref <0.30)

## 2013-07-09 LAB — PRO B NATRIURETIC PEPTIDE: Pro B Natriuretic peptide (BNP): 686.1 pg/mL — ABNORMAL HIGH (ref 0–125)

## 2013-07-09 NOTE — Progress Notes (Signed)
RN made aware that pt is refusing EKG and labs this am. Explained to pt the importance of these procedures. Pt voiced an understanding of the above and continues to refuse. MD made aware. Oncoming RN made aware of the above. Benay Pike, RN

## 2013-07-09 NOTE — Progress Notes (Signed)
DC IV, DC Tele, DC Home. Patient decided to leave AMA. AMA paperwork signed. Patient discharged and does not appear to be in any acute distress or discomfort.

## 2013-07-09 NOTE — Discharge Summary (Signed)
  Discharge summary dictated on 07/09/2013 dictation number is [phone]

## 2013-07-10 NOTE — Discharge Summary (Signed)
NAMEBABY, STAIRS             ACCOUNT NO.:  [id number]  MEDICAL RECORD NO.:  [id number]  LOCATION:  4E23C                        FACILITY:  [facility]  PHYSICIAN:  Eduardo Osier. Sharyn Lull, M.D. DATE OF BIRTH:  [birth year]  DATE OF ADMISSION:  07/08/2013 DATE OF DISCHARGE:  07/09/2013                              DISCHARGE SUMMARY   ADMITTING DIAGNOSES: 1. Status post syncope rule out cardiac arrhythmias. 2. Atypical chest pain. 3. Mild coronary artery disease. 4. 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 5. 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 6. Uncontrolled hypertension. 7. Chronic kidney disease, stage IV. 8. History of atrial flutter in the past. 9. Chronic obstructive pulmonary disease. 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  DISCHARGE DIAGNOSES: 1. Status post syncope rule out cardiac arrhythmias. 2. Atypical chest pain. 3. Mild coronary artery disease. 4. 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 5. 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 6. Uncontrolled hypertension. 7. Chronic kidney disease, stage IV. 8. History of atrial flutter in the past. 9. Chronic obstructive pulmonary disease. 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  The patient signed out against medical advice.  The patient was advised to continue home medicines except reduce the amiodarone to half-tablet daily.  The patient will follow up with Dr. Algie Coffer as outpatient early next week.  BRIEF HISTORY AND HOSPITAL COURSE:  Mr. Hoyt is a 72 year old black male with past medical history significant for hypertension, history of congestive heart failure secondary to systolic dysfunction, history of atrial fib flutter in the past, valvular heart disease with severe mitral regurgitation and tricuspid regurgitation, COPD, chronic kidney disease, stage  IV, refused any anticoagulation in the past, he is being evaluated for MVR or DVR.  He came to the ER following syncopal episode. The patient states he was at his barbershop having hair cut and suddenly felt hot and diaphoretic and had syncopal episode.  The patient denies any palpitation prior to syncopal episode.  Denies any seizure activity. Denies any weakness in the arms or legs.  Denies any slurred speech or blurring of vision.  While in the ER, the patient developed sharp chest pain, lasting less than a minute without associated symptoms.  The patient does give history of exertional dyspnea, but states his activity now is limited, and wanted to be treated medically.  The patient refused being for any further evaluation for valvular heart disease.  Denies any PND, orthopnea, but states occasional leg swelling, and takes extra doses of Lasix.  PAST MEDICAL HISTORY:  As above.  PHYSICAL EXAMINATION:  GENERAL:  He was alert, awake, oriented x3. VITAL SIGNS:  Blood pressure was 154/91, pulse was 71.  He was afebrile. Conjunctiva was pink. NECK:  Supple.  No JVD.  No bruit. LUNGS:  Clear to auscultation without rhonchi or rales. CARDIOVASCULAR:  S1, S2 was normal.  There was 3/6 systolic murmur radiating to the axilla at the apex.  There was 3/6 systolic murmur at the apex radiating to axilla and also left lower sternal border and soft diastolic murmur and S3 gallop noted. EXTREMITIES:  There is no clubbing, cyanosis,  or edema. ABDOMEN:  Soft, nontender. NEUROLOGIC:  Grossly intact.  LABS:  Sodium was 138, potassium 3.6, BUN 44, creatinine 2.85.  This morning, BUN is 40, creatinine 2.64 which is back to baseline.  Three sets of troponin-I were negative.  His proBNP was 907, repeat was 686. Hemoglobin was 11.4, hematocrit 33.2, white count of 4.3.  The CT of the brain was negative.  Chest x-ray showed no active cardiopulmonary disease, stable chronic elevation of right hemidiaphragm  and blunting of the right costophrenic angle.  A 12 lead EKG done in ER showed normal sinus rhythm with PACs and minor nonspecific ST-T wave changes with baseline artifact QTc was mildly prolonged.  BRIEF HOSPITAL COURSE:  The patient was admitted to telemetry unit.  MI was ruled out by serial enzymes.  The patient did not have any episodes of chest pain during the hospital stay, which did not have any cardiac arrhythmias during the hospital stay.  The patient wanted to leave immediately and was signed out AMA.  Patient said he will follow up with Dr. Algie Coffer early next week.     Eduardo Osier. Sharyn Lull, M.D.     [facility]/MED[facility]  D:  07/09/2013  T:  07/10/2013  Job:  [id number]

## 2013-12-29 ENCOUNTER — Other Ambulatory Visit: Payer: Self-pay | Admitting: Cardiovascular Disease

## 2013-12-29 ENCOUNTER — Ambulatory Visit
Admission: RE | Admit: 2013-12-29 | Discharge: 2013-12-29 | Disposition: A | Discharge status: Home / Self Care | Payer: Medicare Other | Source: Ambulatory Visit | Attending: Cardiovascular Disease | Admitting: Cardiovascular Disease

## 2013-12-29 DIAGNOSIS — J40 Bronchitis, not specified as acute or chronic: Secondary | ICD-10-CM

## 2014-10-05 ENCOUNTER — Encounter (HOSPITAL_COMMUNITY): Payer: Self-pay | Admitting: Emergency Medicine

## 2014-10-05 ENCOUNTER — Emergency Department (HOSPITAL_COMMUNITY)
Admission: EM | Admit: 2014-10-05 | Discharge: 2014-10-05 | Discharge status: Against Medical Advice | Payer: Medicare Other | Attending: Emergency Medicine | Admitting: Emergency Medicine

## 2014-10-05 DIAGNOSIS — I10 Essential (primary) hypertension: Secondary | ICD-10-CM | POA: Insufficient documentation

## 2014-10-05 DIAGNOSIS — I509 Heart failure, unspecified: Secondary | ICD-10-CM | POA: Diagnosis not present

## 2014-10-05 DIAGNOSIS — R222 Localized swelling, mass and lump, trunk: Secondary | ICD-10-CM | POA: Diagnosis not present

## 2014-10-05 NOTE — ED Notes (Signed)
Pt c/o left sided mass to chest x 1 week; pt denies pain

## 2014-10-15 ENCOUNTER — Other Ambulatory Visit (INDEPENDENT_AMBULATORY_CARE_PROVIDER_SITE_OTHER): Payer: Self-pay | Admitting: General Surgery

## 2014-10-15 ENCOUNTER — Other Ambulatory Visit: Payer: Self-pay | Admitting: General Surgery

## 2014-10-15 DIAGNOSIS — N6320 Unspecified lump in the left breast, unspecified quadrant: Secondary | ICD-10-CM

## 2014-10-15 DIAGNOSIS — N63 Unspecified lump in unspecified breast: Secondary | ICD-10-CM

## 2014-10-15 NOTE — Addendum Note (Signed)
Addended by: Adin Hector on: 10/15/2014 12:52 PM   Modules accepted: Orders

## 2014-10-19 ENCOUNTER — Ambulatory Visit
Admission: RE | Admit: 2014-10-19 | Discharge: 2014-10-19 | Disposition: A | Discharge status: Home / Self Care | Payer: Medicare Other | Source: Ambulatory Visit | Attending: General Surgery | Admitting: General Surgery

## 2014-10-19 DIAGNOSIS — N63 Unspecified lump in unspecified breast: Secondary | ICD-10-CM

## 2015-09-13 ENCOUNTER — Other Ambulatory Visit: Payer: Self-pay | Admitting: Cardiovascular Disease

## 2015-09-13 ENCOUNTER — Ambulatory Visit
Admission: RE | Admit: 2015-09-13 | Discharge: 2015-09-13 | Disposition: A | Discharge status: Home / Self Care | Payer: Medicare Other | Source: Ambulatory Visit | Attending: Cardiovascular Disease | Admitting: Cardiovascular Disease

## 2015-09-13 DIAGNOSIS — R609 Edema, unspecified: Secondary | ICD-10-CM

## 2016-04-26 ENCOUNTER — Observation Stay (HOSPITAL_COMMUNITY)
Admission: AD | Admit: 2016-04-26 | Discharge: 2016-04-28 | Disposition: A | Discharge status: Home / Self Care | Payer: Medicare Other | Source: Ambulatory Visit | Attending: Cardiovascular Disease | Admitting: Cardiovascular Disease

## 2016-04-26 ENCOUNTER — Observation Stay (HOSPITAL_COMMUNITY): Payer: Medicare Other

## 2016-04-26 ENCOUNTER — Encounter (HOSPITAL_COMMUNITY): Payer: Self-pay | Admitting: *Deleted

## 2016-04-26 DIAGNOSIS — D509 Iron deficiency anemia, unspecified: Secondary | ICD-10-CM | POA: Diagnosis not present

## 2016-04-26 DIAGNOSIS — N281 Cyst of kidney, acquired: Secondary | ICD-10-CM | POA: Diagnosis not present

## 2016-04-26 DIAGNOSIS — J449 Chronic obstructive pulmonary disease, unspecified: Secondary | ICD-10-CM | POA: Diagnosis not present

## 2016-04-26 DIAGNOSIS — I5022 Chronic systolic (congestive) heart failure: Secondary | ICD-10-CM | POA: Insufficient documentation

## 2016-04-26 DIAGNOSIS — R109 Unspecified abdominal pain: Secondary | ICD-10-CM

## 2016-04-26 DIAGNOSIS — I132 Hypertensive heart and chronic kidney disease with heart failure and with stage 5 chronic kidney disease, or end stage renal disease: Secondary | ICD-10-CM | POA: Diagnosis not present

## 2016-04-26 DIAGNOSIS — I420 Dilated cardiomyopathy: Secondary | ICD-10-CM | POA: Insufficient documentation

## 2016-04-26 DIAGNOSIS — I482 Chronic atrial fibrillation: Secondary | ICD-10-CM | POA: Insufficient documentation

## 2016-04-26 DIAGNOSIS — E876 Hypokalemia: Secondary | ICD-10-CM | POA: Diagnosis not present

## 2016-04-26 DIAGNOSIS — I4892 Unspecified atrial flutter: Secondary | ICD-10-CM | POA: Diagnosis not present

## 2016-04-26 DIAGNOSIS — K922 Gastrointestinal hemorrhage, unspecified: Secondary | ICD-10-CM | POA: Diagnosis not present

## 2016-04-26 DIAGNOSIS — Z86010 Personal history of colonic polyps: Secondary | ICD-10-CM | POA: Insufficient documentation

## 2016-04-26 DIAGNOSIS — Z85118 Personal history of other malignant neoplasm of bronchus and lung: Secondary | ICD-10-CM | POA: Diagnosis not present

## 2016-04-26 DIAGNOSIS — I083 Combined rheumatic disorders of mitral, aortic and tricuspid valves: Secondary | ICD-10-CM | POA: Diagnosis not present

## 2016-04-26 DIAGNOSIS — N189 Chronic kidney disease, unspecified: Secondary | ICD-10-CM

## 2016-04-26 DIAGNOSIS — K625 Hemorrhage of anus and rectum: Secondary | ICD-10-CM | POA: Diagnosis present

## 2016-04-26 DIAGNOSIS — E785 Hyperlipidemia, unspecified: Secondary | ICD-10-CM | POA: Diagnosis not present

## 2016-04-26 DIAGNOSIS — N185 Chronic kidney disease, stage 5: Secondary | ICD-10-CM | POA: Diagnosis not present

## 2016-04-26 DIAGNOSIS — N2581 Secondary hyperparathyroidism of renal origin: Secondary | ICD-10-CM | POA: Diagnosis not present

## 2016-04-26 DIAGNOSIS — I5023 Acute on chronic systolic (congestive) heart failure: Secondary | ICD-10-CM | POA: Diagnosis present

## 2016-04-26 DIAGNOSIS — I10 Essential (primary) hypertension: Secondary | ICD-10-CM | POA: Diagnosis present

## 2016-04-26 DIAGNOSIS — I509 Heart failure, unspecified: Secondary | ICD-10-CM

## 2016-04-26 HISTORY — DX: Unspecified atrial fibrillation: I48.91

## 2016-04-26 LAB — CBC WITH DIFFERENTIAL/PLATELET
BASOS ABS: 0 10*3/uL (ref 0.0–0.1)
BASOS PCT: 1 %
EOS ABS: 0.2 10*3/uL (ref 0.0–0.7)
EOS PCT: 4 %
HCT: 37.5 % — ABNORMAL LOW (ref 39.0–52.0)
Hemoglobin: 12.2 g/dL — ABNORMAL LOW (ref 13.0–17.0)
LYMPHS ABS: 1.1 10*3/uL (ref 0.7–4.0)
Lymphocytes Relative: 24 %
MCH: 29.8 pg (ref 26.0–34.0)
MCHC: 32.5 g/dL (ref 30.0–36.0)
MCV: 91.7 fL (ref 78.0–100.0)
Monocytes Absolute: 0.8 10*3/uL (ref 0.1–1.0)
Monocytes Relative: 17 %
Neutro Abs: 2.5 10*3/uL (ref 1.7–7.7)
Neutrophils Relative %: 54 %
PLATELETS: 129 10*3/uL — AB (ref 150–400)
RBC: 4.09 MIL/uL — AB (ref 4.22–5.81)
RDW: 14.4 % (ref 11.5–15.5)
WBC: 4.6 10*3/uL (ref 4.0–10.5)

## 2016-04-26 LAB — COMPREHENSIVE METABOLIC PANEL
ALT: 36 U/L (ref 17–63)
ANION GAP: 10 (ref 5–15)
AST: 39 U/L (ref 15–41)
Albumin: 3.6 g/dL (ref 3.5–5.0)
Alkaline Phosphatase: 57 U/L (ref 38–126)
BUN: 82 mg/dL — AB (ref 6–20)
CALCIUM: 9.5 mg/dL (ref 8.9–10.3)
CHLORIDE: 101 mmol/L (ref 101–111)
CO2: 23 mmol/L (ref 22–32)
CREATININE: 4.53 mg/dL — AB (ref 0.61–1.24)
GFR, EST AFRICAN AMERICAN: 13 mL/min — AB (ref >60)
GFR, EST NON AFRICAN AMERICAN: 11 mL/min — AB (ref >60)
Glucose, Bld: 92 mg/dL (ref 65–99)
POTASSIUM: 3.3 mmol/L — AB (ref 3.5–5.1)
SODIUM: 134 mmol/L — AB (ref 135–145)
TOTAL PROTEIN: 6.8 g/dL (ref 6.5–8.1)
Total Bilirubin: 0.5 mg/dL (ref 0.3–1.2)

## 2016-04-26 LAB — TSH: TSH: 1.034 u[IU]/mL (ref 0.350–4.500)

## 2016-04-26 LAB — BRAIN NATRIURETIC PEPTIDE: B NATRIURETIC PEPTIDE 5: 424.2 pg/mL — AB (ref 0.0–100.0)

## 2016-04-26 LAB — FERRITIN: FERRITIN: 376 ng/mL — AB (ref 24–336)

## 2016-04-26 LAB — URIC ACID: URIC ACID, SERUM: 9 mg/dL — AB (ref 4.4–7.6)

## 2016-04-26 LAB — IRON AND TIBC
Iron: 42 ug/dL — ABNORMAL LOW (ref 45–182)
Saturation Ratios: 17 % — ABNORMAL LOW (ref 17.9–39.5)
TIBC: 241 ug/dL — ABNORMAL LOW (ref 250–450)
UIBC: 199 ug/dL

## 2016-04-26 LAB — PROTIME-INR
INR: 1.16 (ref 0.00–1.49)
PROTHROMBIN TIME: 15 s (ref 11.6–15.2)

## 2016-04-26 MED ORDER — ACETAMINOPHEN 325 MG PO TABS: 650.0000 mg | ORAL_TABLET | Freq: Four times a day (QID) | ORAL | Status: DC | PRN

## 2016-04-26 MED ORDER — FUROSEMIDE 80 MG PO TABS: 80.0000 mg | ORAL_TABLET | Freq: Two times a day (BID) | ORAL | Status: DC

## 2016-04-26 MED ORDER — ISOSORB DINITRATE-HYDRALAZINE 20-37.5 MG PO TABS
1.0000 | ORAL_TABLET | Freq: Three times a day (TID) | ORAL | Status: DC
  Filled 2016-04-26: qty 1

## 2016-04-26 MED ORDER — ENSURE ENLIVE PO LIQD
237.0000 mL | Freq: Two times a day (BID) | ORAL | Status: DC
  Administered 2016-04-26: 237 mL via ORAL

## 2016-04-26 MED ORDER — NITROGLYCERIN 0.4 MG SL SUBL: 0.4000 mg | SUBLINGUAL_TABLET | SUBLINGUAL | Status: DC | PRN

## 2016-04-26 MED ORDER — CARVEDILOL 12.5 MG PO TABS
12.5000 mg | ORAL_TABLET | Freq: Two times a day (BID) | ORAL | Status: DC
  Administered 2016-04-27 – 2016-04-28 (×3): 12.5 mg via ORAL
  Filled 2016-04-26 (×4): qty 1

## 2016-04-26 MED ORDER — ATORVASTATIN CALCIUM 10 MG PO TABS
10.0000 mg | ORAL_TABLET | Freq: Every day | ORAL | Status: DC
  Filled 2016-04-26: qty 1

## 2016-04-26 MED ORDER — SODIUM CHLORIDE 0.9% FLUSH
3.0000 mL | Freq: Two times a day (BID) | INTRAVENOUS | Status: DC
Start: 2016-04-26 — End: 2016-04-28
  Administered 2016-04-27 (×2): 3 mL via INTRAVENOUS

## 2016-04-26 MED ORDER — FUROSEMIDE 10 MG/ML IJ SOLN
60.0000 mg | Freq: Once | INTRAMUSCULAR | Status: AC
  Administered 2016-04-26: 60 mg via INTRAVENOUS
  Filled 2016-04-26: qty 6

## 2016-04-26 MED ORDER — ATORVASTATIN CALCIUM 10 MG PO TABS
10.0000 mg | ORAL_TABLET | Freq: Every day | ORAL | Status: DC
  Administered 2016-04-27: 10 mg via ORAL
  Filled 2016-04-26: qty 1

## 2016-04-26 MED ORDER — AMIODARONE HCL 200 MG PO TABS
200.0000 mg | ORAL_TABLET | Freq: Every day | ORAL | Status: DC
Start: 2016-04-26 — End: 2016-04-28
  Administered 2016-04-27 – 2016-04-28 (×2): 200 mg via ORAL
  Filled 2016-04-26 (×3): qty 1

## 2016-04-26 NOTE — H&P (Signed)
Referring Physician:  [name] is an 75 y.o. male.                       Chief Complaint: Bleeding per rectum.  HPI: 75 year old male presented with recurrent black stools and intermittent red blood per rectum x 1-2 weeks. No fever or abdominal pain. No nausea, vomiting or diarrhea. Patient has PMH of CKD, III, Dilated cardiomyopathy, hypertension, hyperlipidemia and atrial flutter.  Past Medical History  Diagnosis Date  . Hemoptysis   . Renal insufficiency   . HTN (hypertension)   . CHF (congestive heart failure)   . Cancer       Past Surgical History  Procedure Laterality Date  . R vats  10/15/2008  . Bronchoscopy  10/11/2009    No family history on file. Social History:  reports that he has never smoked. He does not have any smokeless tobacco history on file. He reports that he drinks alcohol. He reports that he does not use illicit drugs.  Allergies:  Allergies  Allergen Reactions  . Warfarin Sodium     REACTION: Free Bleeder    Medications Prior to Admission  Medication Sig Dispense Refill  . amiodarone (PACERONE) 200 MG tablet Take 200 mg by mouth daily.     Marland Kitchen atorvastatin (LIPITOR) 10 MG tablet Take 10 mg by mouth daily.     . carvedilol (COREG) 12.5 MG tablet Take 12.5 mg by mouth 2 (two) times daily with a meal.     . furosemide (LASIX) 80 MG tablet Take 80 mg by mouth 2 (two) times daily.     . isosorbide-hydrALAZINE (BIDIL) 20-37.5 MG per tablet Take 1 tablet by mouth 3 (three) times daily.     . nitroGLYCERIN (NITROSTAT) 0.4 MG SL tablet Place 0.4 mg under the tongue every 5 (five) minutes as needed.       No results found for this or any previous visit (from the past 48 hour(s)). No results found.  Review Of Systems Constitutional: Negative for fever and chills.  HENT: Negative for hearing loss.  Eyes: Negative for blurred vision and double vision.  Respiratory: Negative for cough, hemoptysis, sputum production and shortness of breath.   Cardiovascular: Positive for chest pain. Negative for palpitations, orthopnea and claudication.  Gastrointestinal: Negative for nausea, vomiting, abdominal pain and diarrhea.  Genitourinary: Negative for dysuria.  Neurological: Positive for dizziness and loss of consciousness. Negative for focal weakness, seizures and headaches.  There were no vitals taken for this visit. Physical Exam  Constitutional: He is well built and nourished.  HENT: Head: Normocephalic and atraumatic. Brown eyes, conjunctivae are pale pink. Pupils are equal, round, and reactive to light.  No scleral icterus.  Neck: Normal range of motion. Neck supple. + JVD present. No tracheal deviation present. No thyromegaly present.  Cardiovascular: Regular rate and rhythm, Normal S1 and S2. 3/6 systolic murmur noted at Radiating to axilla and also had lower left sternal border and soft diastolic murmur.  Respiratory: Effort normal and breath sounds normal.   GI: Soft. Bowel sounds are normal. He exhibits no distension. There is no tenderness. There is no rebound.  Musculoskeletal: He exhibits no edema.  Neurological: He is alert and oriented to person, place, and time.   Assessment/Plan Acute GI bleeding Chronic left heart systolic failure. Dilated cardiomyopathy Hypertension Hyperlipidemia COPD CKD, III  Place in observation. Blood work. GI consult.    [name], MD  04/26/2016, 3:51 PM

## 2016-04-26 NOTE — Progress Notes (Signed)
Rick Tyler [id number] Admission Data: 04/26/2016 5:04 PM Attending Provider: Dixie Dials, MD  IRS:WNIOEVO,JJKK S, MD Consults/ Treatment Team:    Rick Tyler is a 75 y.o. male patient admitted from ED awake, alert  & orientated  X 3,  Full Code, VSS - Height '6\' 1"'$  (1.854 m), weight 80.74 kg (178 lb)., no c/o shortness of breath, no c/o chest pain, no distress noted. Tele # 18 placed and pt is currently running:atrial fibrillation, with ventricular rate of 71.    Allergies:   Allergies  Allergen Reactions  . Warfarin Sodium     REACTION: Free Bleeder     Past Medical History  Diagnosis Date  . Hemoptysis   . Renal insufficiency   . HTN (hypertension)   . CHF (congestive heart failure) ([place])   . Cancer [facility])     History:  obtained from the patient. Tobacco/alcohol: denied social drinker  Pt orientation to unit, room and routine. Information packet given to patient/family and safety video watched.  Admission INP armband ID verified with patient/family, and in place. SR up x 2, fall risk assessment complete with Patient and family verbalizing understanding of risks associated with falls. Pt verbalizes an understanding of how to use the call bell and to call for help before getting out of bed.  Skin, clean-dry- intact without evidence of bruising, or skin tears.   No evidence of skin break down noted on exam. no rashes, no ecchymoses, no petechiae    Will cont to monitor and assist as needed.  Rick Tyler Rick Sheffield, RN 04/26/2016 5:04 PM

## 2016-04-27 ENCOUNTER — Observation Stay (HOSPITAL_COMMUNITY): Payer: Medicare Other

## 2016-04-27 ENCOUNTER — Encounter (HOSPITAL_COMMUNITY): Payer: Self-pay | Admitting: Internal Medicine

## 2016-04-27 DIAGNOSIS — K922 Gastrointestinal hemorrhage, unspecified: Secondary | ICD-10-CM | POA: Diagnosis not present

## 2016-04-27 LAB — CBC
HCT: 38.1 % — ABNORMAL LOW (ref 39.0–52.0)
Hemoglobin: 12.7 g/dL — ABNORMAL LOW (ref 13.0–17.0)
MCH: 30 pg (ref 26.0–34.0)
MCHC: 33.3 g/dL (ref 30.0–36.0)
MCV: 90.1 fL (ref 78.0–100.0)
PLATELETS: 132 10*3/uL — AB (ref 150–400)
RBC: 4.23 MIL/uL (ref 4.22–5.81)
RDW: 14.4 % (ref 11.5–15.5)
WBC: 4.5 10*3/uL (ref 4.0–10.5)

## 2016-04-27 LAB — URINALYSIS, ROUTINE W REFLEX MICROSCOPIC
BILIRUBIN URINE: NEGATIVE
GLUCOSE, UA: NEGATIVE mg/dL
HGB URINE DIPSTICK: NEGATIVE
KETONES UR: NEGATIVE mg/dL
Leukocytes, UA: NEGATIVE
Nitrite: NEGATIVE
PROTEIN: NEGATIVE mg/dL
Specific Gravity, Urine: 1.01 (ref 1.005–1.030)
pH: 5.5 (ref 5.0–8.0)

## 2016-04-27 LAB — OCCULT BLOOD X 1 CARD TO LAB, STOOL
FECAL OCCULT BLD: NEGATIVE
FECAL OCCULT BLD: NEGATIVE

## 2016-04-27 LAB — BASIC METABOLIC PANEL
ANION GAP: 11 (ref 5–15)
BUN: 88 mg/dL — AB (ref 6–20)
CALCIUM: 9.7 mg/dL (ref 8.9–10.3)
CO2: 25 mmol/L (ref 22–32)
CREATININE: 4.2 mg/dL — AB (ref 0.61–1.24)
Chloride: 99 mmol/L — ABNORMAL LOW (ref 101–111)
GFR calc Af Amer: 14 mL/min — ABNORMAL LOW (ref >60)
GFR calc non Af Amer: 12 mL/min — ABNORMAL LOW (ref >60)
GLUCOSE: 101 mg/dL — AB (ref 65–99)
POTASSIUM: 3.4 mmol/L — AB (ref 3.5–5.1)
Sodium: 135 mmol/L (ref 135–145)

## 2016-04-27 LAB — ECHOCARDIOGRAM COMPLETE
HEIGHTINCHES: 73 in
WEIGHTICAEL: 2848 [oz_av]

## 2016-04-27 LAB — SODIUM, URINE, RANDOM: Sodium, Ur: 93 mmol/L

## 2016-04-27 MED ORDER — ALLOPURINOL 100 MG PO TABS
100.0000 mg | ORAL_TABLET | Freq: Every day | ORAL | Status: DC
  Administered 2016-04-27 – 2016-04-28 (×2): 100 mg via ORAL
  Filled 2016-04-27 (×2): qty 1

## 2016-04-27 MED ORDER — POTASSIUM CHLORIDE ER 10 MEQ PO TBCR
10.0000 meq | EXTENDED_RELEASE_TABLET | Freq: Two times a day (BID) | ORAL | Status: DC
  Administered 2016-04-27 – 2016-04-28 (×3): 10 meq via ORAL
  Filled 2016-04-27 (×5): qty 1

## 2016-04-27 MED ORDER — FERROUS SULFATE 325 (65 FE) MG PO TABS
325.0000 mg | ORAL_TABLET | Freq: Two times a day (BID) | ORAL | Status: DC
  Administered 2016-04-27 – 2016-04-28 (×2): 325 mg via ORAL
  Filled 2016-04-27 (×2): qty 1

## 2016-04-27 MED ORDER — FUROSEMIDE 10 MG/ML IJ SOLN
60.0000 mg | Freq: Once | INTRAMUSCULAR | Status: AC
  Administered 2016-04-27: 60 mg via INTRAVENOUS
  Filled 2016-04-27: qty 6

## 2016-04-27 NOTE — Progress Notes (Signed)
Patient had a bright red bowel movement RN witnessed in toilet. Patient removed hat from commode stating he did not know we were still wanting a collection. Patient was told this morning that we were going to test his stool and needed to keep hat in commode, but education provided once again so we can collect another stool for test 3rd hemoccult slide. Patient denies dizziness or associated symptoms.

## 2016-04-27 NOTE — Consult Note (Signed)
Date: 04/27/2016               Patient Name:  Rick Tyler MRN: [id number]  DOB: 1941-08-19 Age / Sex: 75 y.o., male   PCP: Dixie Dials, MD         Requesting Physician: Dr. Dixie Dials, MD    Consulting Reason:  Rise in creatinine      Chief Complaint: blood in stool   History of Present Illness: Patient is a 75 yo M with a PMHx of HTN, CHF (EF 35-40% in 2011), dilated cardiomyopathy, A-flutter, stage 4 CKD presenting to the hospital with a 1-2 week history of recurrent black stools and bright red blood per rectum. Patient denies having any abdominal pain, nausea, vomiting, or diarrhea. Reports having adequate oral intake of food and beverages. Labs on admission showing Scr 4.53, creatinine was 2.6 in 2014. Patient denies any prior history of kidney disease and states he has never seen a nephrologist. Denies having any difficulty with urination. Denies any NSAID use. Reports taking Lasix 80 mg 1-2 times per day for his heart failure. Occ cramps,has chronic edema, itching.  Meds: Current Facility-Administered Medications  Medication Dose Route Frequency Provider Last Rate Last Dose  . acetaminophen (TYLENOL) tablet 650 mg  650 mg Oral Q6H PRN Dixie Dials, MD      . allopurinol (ZYLOPRIM) tablet 100 mg  100 mg Oral Daily Dixie Dials, MD   100 mg at 04/27/16 0946  . amiodarone (PACERONE) tablet 200 mg  200 mg Oral Daily Dixie Dials, MD   200 mg at 04/27/16 0946  . atorvastatin (LIPITOR) tablet 10 mg  10 mg Oral QHS Dixie Dials, MD      . carvedilol (COREG) tablet 12.5 mg  12.5 mg Oral BID WC Dixie Dials, MD   12.5 mg at 04/27/16 0756  . nitroGLYCERIN (NITROSTAT) SL tablet 0.4 mg  0.4 mg Sublingual Q5 min PRN Dixie Dials, MD      . potassium chloride (K-DUR) CR tablet 10 mEq  10 mEq Oral BID Dixie Dials, MD   10 mEq at 04/27/16 0945  . sodium chloride flush (NS) 0.9 % injection 3 mL  3 mL Intravenous Q12H Dixie Dials, MD   3 mL at 04/27/16 0948    Allergies: Allergies as of  04/26/2016 - Review Complete 04/26/2016  Allergen Reaction Noted  . Warfarin sodium     Past Medical History  Diagnosis Date  . Hemoptysis   . Renal insufficiency   . HTN (hypertension)   . CHF (congestive heart failure) ([place])   . Cancer ([place])   . A-fib [facility])    Past Surgical History  Procedure Laterality Date  . R vats  10/15/2008  . Bronchoscopy  10/11/2009   History reviewed. No pertinent family history. Social History   Social History  . Marital Status: Single    Spouse Name: N/A  . Number of Children: N/A  . Years of Education: N/A   Occupational History  . Not on file.   Social History Main Topics  . Smoking status: Never Smoker   . Smokeless tobacco: Not on file  . Alcohol Use: 3.6 oz/week    6 Cans of beer per week     Comment: not on a regular basis  . Drug Use: No  . Sexual Activity: Not Currently   Other Topics Concern  . Not on file   Social History Narrative    Review of Systems: Pertinent positives mentioned in HPI. All other ROS  negative.   Physical Exam: Blood pressure 140/109, pulse 86, temperature 97.5 F (36.4 C), temperature source Oral, resp. rate 18, height '6\' 1"'$  (1.854 m), weight 178 lb (80.74 kg), SpO2 100 %. Physical Exam  Constitutional: He is oriented to person, place, and time. He appears well-developed and well-nourished. No distress.  HENT:  Head: Normocephalic and atraumatic.  Mouth/Throat: Oropharynx is clear and moist.  Eyes: EOM are normal.  Neck: Neck supple. No tracheal deviation present.  Cardiovascular: Normal rate, regular rhythm and intact distal pulses.  Exam reveals no gallop and no friction rub.   Pulmonary/Chest: Effort normal and breath sounds normal. No respiratory distress. He has no wheezes. He has no rales.  Abdominal: Soft. Bowel sounds are normal. He exhibits no distension. There is no tenderness. There is no guarding.  Musculoskeletal:  Trace pitting edema of bilateral lower extremities   Neurological:  He is alert and oriented to person, place, and time.  Skin: Skin is warm and dry.  Has LV lift on exam. Angry about status.  Lab results: Basic Metabolic Panel:  Recent Labs  04/26/16 1652 04/27/16 0602  NA 134* 135  K 3.3* 3.4*  CL 101 99*  CO2 23 25  GLUCOSE 92 101*  BUN 82* 88*  CREATININE 4.53* 4.20*  CALCIUM 9.5 9.7   Liver Function Tests:  Recent Labs  04/26/16 1652  AST 39  ALT 36  ALKPHOS 57  BILITOT 0.5  PROT 6.8  ALBUMIN 3.6   CBC:  Recent Labs  04/26/16 1652 04/27/16 0602  WBC 4.6 4.5  NEUTROABS 2.5  --   HGB 12.2* 12.7*  HCT 37.5* 38.1*  MCV 91.7 90.1  PLT 129* 132*    Thyroid Function Tests:  Recent Labs  04/26/16 2100  TSH 1.034   Anemia Panel:  Recent Labs  04/26/16 2100  FERRITIN 376*  TIBC 241*  IRON 42*   Coagulation:  Recent Labs  04/26/16 1652  LABPROT 15.0  INR 1.16   Imaging results:  X-ray Chest Pa And Lateral  04/26/2016  CLINICAL DATA:  CHF. EXAM: CHEST  2 VIEW COMPARISON:  Chest radiographs 12/29/2013 FINDINGS: Postsurgical change in the right hemithorax with stable volume loss and surgical clips at the right hilum. Cardiomediastinal contours are unchanged with tortuosity and atherosclerosis of the thoracic aorta. There is vascular congestion and probable mild perihilar edema. Suspect small pleural effusions. No pneumothorax. No acute osseous abnormalities, post-surgical/remote traumatic change in the right ribs. IMPRESSION: 1. Findings consistent with mild congestive heart failure. 2. Postsurgical change in the right hemithorax with volume loss, similar to prior. Electronically Signed   By: Jeb Levering M.D.   On: 04/26/2016 17:24   US Abdomen Complete  04/27/2016  CLINICAL DATA:  Recurrent black stools with intermittent hematochezia over the past 1-2 weeks, the patient denies abdominal pain, history of hypertension and renal insufficiency. EXAM: ABDOMEN ULTRASOUND COMPLETE COMPARISON:  Renal ultrasound of March 10, 2011 and CT scan of the chest of December twelfth 2012. FINDINGS: Gallbladder: No gallstones or wall thickening visualized. No sonographic Murphy sign noted by sonographer. Common bile duct: Diameter: 7.2 mm. No abnormal intraluminal echoes are observed. Liver: The hepatic echotexture is normal. There is no focal mass nor ductal dilation. IVC: No abnormality visualized. Pancreas: Evaluation of the pancreatic head and tail is limited. The visualized portions of the pancreatic body are grossly normal. Spleen: Size and appearance within normal limits. Right Kidney: Length: 9.4 cm. There is a lower pole simple appearing cyst measuring  1.3 cm in greatest dimension. The renal cortical echotexture remains lower than that of the adjacent liver. There is no hydronephrosis. Left Kidney: Length: 10.3 cm. The renal cortical echotexture is normal. There is a midpole cyst present which measures 1.1 cm in greatest dimension. Abdominal aorta: No aneurysm visualized. Other findings: There is no ascites. IMPRESSION: 1. No gallstones or evidence of acute cholecystitis. Normal appearance of the liver. Limited visualization of the pancreas. 2. Simple appearing cysts in both kidneys.  No hydronephrosis. Electronically Signed   By: David  Martinique M.D.   On: 04/27/2016 09:29    Assessment, Plan, & Recommendations by Problem: Principal Problem:   Acute GI bleeding Active Problems:   Essential hypertension   Acute on chronic left systolic heart failure (HCC)  CKD stage 5 Patient has a history of CKD stage 4 now presenting with SCr 4.53 and GFR 13. GFR was SCr was 2.6 in 2014. He is presenting for a GI bleed, however, his Hgb is at baseline and BP stable. As such, AKI is less likely to cause the rise in his creatinine. The rise in his creatinine is rather chronic secondary to worsening renal disease. He does have some dependant edema on exam despite taking Lasix daily. In addition, uric acid level is elevated. Do not feel has  acute component.  .  This is CKD5 and will progress in subacute fashion. -Hold home Lasix  -Strict I/Os -Urinalysis pending -Urine Na pending  -Urine creatinine pending  -PTH pending  -Renal function panel in a.m. -Renal U/S pending   GI bleed Presenting with a 1-2 week history of recurrent melena and hematochezia. FOBT x 2 negative. Hgb at baseline.  -GI is following  Iron deficiency anemia  Hgb 12.2 and MCV 91.7 on admission. Labs showing low iron and saturation ratio.  -Start Ferrous sulfate 325 mg BID -CBC in a.m. ?GI Blood loss HTN BP stable. -Continue home medication Carvedilol 12.5 mg BID  CHF Echo from 2011 showing EF 35-40%.  -Repeat echo has been done; results pending -Hold home Lasix -Continue Carvedilol 12.5 mg BID  Hx of A-flutter -Continue Amiodarone   HLD -Continue Lipitor 10 mg    Signed: Shela Leff, MD 04/27/2016, 1:30 PM  I have seen and examined this patient and agree with the plan of care seen,eval, examined, counseled patient and discussed with resident.  Not optimistic he will participate in planning or F/U .  Leah Skora L 04/27/2016, 5:13 PM

## 2016-04-27 NOTE — Consult Note (Signed)
Referring Provider:   Dr. Doylene Canard Primary Care Physician:  [name], MD Primary Gastroenterologist:  Dr. Teena Irani  Reason for Consultation:  Dark stools  HPI: Rick Tyler is a 75 y.o. male admitted yesterday through the emergency room with recurrent dark stool and intermittent rectal bleeding for a week or 2.  There is no family history of colon cancer, and he denies any recent change in his bowel habits.  Rectal exam by Dr. Doylene Canard did not show any bloody stool.  Review of our office records shows that in 2003, he had a colonoscopy by Dr. 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 He does not recall having had that procedure, which I believe was performed while he was in the hospital for GI bleeding.  The patient's stool on this admission has been confirmed to be Hemoccult negative, and it is light tan in color.  In the meantime, the patient has a history of lung cancer in 2011, treated with surgery, as well as congestive heart failure, chronic kidney disease, and atrial flutter but is not maintained on anticoagulation, aspirin, nor is are any history of significant exposure to ulcerogenic medications such as ibuprofen.  A previous ejection fraction several years ago was around 35%, but the patient is having an updated echocardiogram today.     Past Medical History  Diagnosis Date  . Hemoptysis   . Renal insufficiency   . HTN (hypertension)   . CHF (congestive heart failure) ([place])   . Cancer ([place])   . A-fib [facility])     Past Surgical History  Procedure Laterality Date  . R vats  10/15/2008  . Bronchoscopy  10/11/2009    Prior to Admission medications   Medication Sig Start Date End Date Taking? Authorizing Provider  amiodarone (PACERONE) 200 MG tablet Take 200 mg by mouth daily.    Yes Historical Provider, MD  atorvastatin (LIPITOR) 10 MG tablet Take 10 mg by mouth  daily.    Yes Historical Provider, MD  carvedilol (COREG) 12.5 MG tablet Take 12.5 mg by mouth 2 (two) times daily with a meal.    Yes Historical Provider, MD  furosemide (LASIX) 80 MG tablet Take 80 mg by mouth 2 (two) times daily.    Yes Historical Provider, MD  nitroGLYCERIN (NITROSTAT) 0.4 MG SL tablet Place 0.4 mg under the tongue every 5 (five) minutes as needed.    Yes Historical Provider, MD  isosorbide-hydrALAZINE (BIDIL) 20-37.5 MG per tablet Take 1 tablet by mouth 3 (three) times daily. Reported on 04/26/2016    Historical Provider, MD    Current Facility-Administered Medications  Medication Dose Route Frequency Provider Last Rate Last Dose  . acetaminophen (TYLENOL) tablet 650 mg  650 mg Oral Q6H PRN Dixie Dials, MD      . allopurinol (ZYLOPRIM) tablet 100 mg  100 mg Oral Daily Dixie Dials, MD   100 mg at 04/27/16 0946  . amiodarone (PACERONE) tablet 200 mg  200 mg Oral Daily Dixie Dials, MD   200 mg at 04/27/16 0946  . atorvastatin (LIPITOR) tablet 10 mg  10 mg Oral QHS Dixie Dials, MD      . carvedilol (COREG) tablet 12.5 mg  12.5 mg Oral BID WC Dixie Dials, MD   12.5 mg at 04/27/16 0756  . nitroGLYCERIN (NITROSTAT) SL tablet 0.4 mg  0.4 mg Sublingual Q5 min PRN Dixie Dials, MD      .  potassium chloride (K-DUR) CR tablet 10 mEq  10 mEq Oral BID Dixie Dials, MD   10 mEq at 04/27/16 0945  . sodium chloride flush (NS) 0.9 % injection 3 mL  3 mL Intravenous Q12H Dixie Dials, MD   3 mL at 04/27/16 0948    Allergies as of 04/26/2016 - Review Complete 04/26/2016  Allergen Reaction Noted  . Warfarin sodium      History reviewed. No pertinent family history.  Social History   Social History  . Marital Status: Single    Spouse Name: N/A  . Number of Children: N/A  . Years of Education: N/A   Occupational History  . Not on file.   Social History Main Topics  . Smoking status: Never Smoker   . Smokeless tobacco: Not on file  . Alcohol Use: 3.6 oz/week    6 Cans of  beer per week     Comment: not on a regular basis  . Drug Use: No  . Sexual Activity: Not Currently   Other Topics Concern  . Not on file   Social History Narrative    Review of Systems: See history of present illness. The patient indicates that he is quite active.  Physical Exam: Vital signs in last 24 hours: Temp:  [97.5 F (36.4 C)-98.8 F (37.1 C)] 97.5 F (36.4 C) (07/20 0548) Pulse Rate:  [75-86] 86 (07/20 0944) Resp:  [17-18] 18 (07/20 0548) BP: (120-140)/(82-109) 140/109 mmHg (07/20 0800) SpO2:  [100 %] 100 % (07/20 0800) Weight:  [80.74 kg (178 lb)] 80.74 kg (178 lb) (07/19 1607) Last BM Date: 04/26/16 General:   Alert,  Well-developed, well-nourished, pleasant and cooperative in NAD Head:  Normocephalic and atraumatic. Eyes:  Sclera clear, no icterus.   Conjunctiva pink. Neck:   No masses or thyromegaly. Lungs:  Clear throughout to auscultation.   No wheezes, crackles, or rhonchi. No evident respiratory distress. Heart:  Frequent premature beats, no murmurs, clicks, rubs,  or gallops. Abdomen:  Soft, nontender, and nondistended. No overt masses, hepatosplenomegaly or ventral hernias noted. Normal bowel sounds.  Rectal:  Showed brown stool per Dr. Doylene Canard. Msk:   Symmetrical without gross deformities. Pulses:  Normal pulses noted. Extremities:   No tibial edema. His hands are cold and dusky, even while he is up and walking around. Neurologic:  Alert and coherent;  grossly normal neurologically. Skin:  Intact without significant lesions or rashes. Cervical Nodes:  No significant cervical adenopathy. Psych:   Alert and cooperative. Normal mood and affect.  Intake/Output from previous day:   Intake/Output this shift: Total I/O In: 220 [P.O.:220] Out: -   Lab Results:  Recent Labs  04/26/16 1652 04/27/16 0602  WBC 4.6 4.5  HGB 12.2* 12.7*  HCT 37.5* 38.1*  PLT 129* 132*   BMET  Recent Labs  04/26/16 1652 04/27/16 0602  NA 134* 135  K 3.3* 3.4*   CL 101 99*  CO2 23 25  GLUCOSE 92 101*  BUN 82* 88*  CREATININE 4.53* 4.20*  CALCIUM 9.5 9.7   LFT  Recent Labs  04/26/16 1652  PROT 6.8  ALBUMIN 3.6  AST 39  ALT 36  ALKPHOS 57  BILITOT 0.5   PT/INR  Recent Labs  04/26/16 1652  LABPROT 15.0  INR 1.16    Studies/Results: X-ray Chest Pa And Lateral  04/26/2016  CLINICAL DATA:  CHF. EXAM: CHEST  2 VIEW COMPARISON:  Chest radiographs 12/29/2013 FINDINGS: Postsurgical change in the right hemithorax with stable volume loss and surgical  clips at the right hilum. Cardiomediastinal contours are unchanged with tortuosity and atherosclerosis of the thoracic aorta. There is vascular congestion and probable mild perihilar edema. Suspect small pleural effusions. No pneumothorax. No acute osseous abnormalities, post-surgical/remote traumatic change in the right ribs. IMPRESSION: 1. Findings consistent with mild congestive heart failure. 2. Postsurgical change in the right hemithorax with volume loss, similar to prior. Electronically Signed   By: Jeb Levering M.D.   On: 04/26/2016 17:24   US Abdomen Complete  04/27/2016  CLINICAL DATA:  Recurrent black stools with intermittent hematochezia over the past 1-2 weeks, the patient denies abdominal pain, history of hypertension and renal insufficiency. EXAM: ABDOMEN ULTRASOUND COMPLETE COMPARISON:  Renal ultrasound of March 10, 2011 and CT scan of the chest of December twelfth 2012. FINDINGS: Gallbladder: No gallstones or wall thickening visualized. No sonographic Murphy sign noted by sonographer. Common bile duct: Diameter: 7.2 mm. No abnormal intraluminal echoes are observed. Liver: The hepatic echotexture is normal. There is no focal mass nor ductal dilation. IVC: No abnormality visualized. Pancreas: Evaluation of the pancreatic head and tail is limited. The visualized portions of the pancreatic body are grossly normal. Spleen: Size and appearance within normal limits. Right Kidney: Length: 9.4  cm. There is a lower pole simple appearing cyst measuring 1.3 cm in greatest dimension. The renal cortical echotexture remains lower than that of the adjacent liver. There is no hydronephrosis. Left Kidney: Length: 10.3 cm. The renal cortical echotexture is normal. There is a midpole cyst present which measures 1.1 cm in greatest dimension. Abdominal aorta: No aneurysm visualized. Other findings: There is no ascites. IMPRESSION: 1. No gallstones or evidence of acute cholecystitis. Normal appearance of the liver. Limited visualization of the pancreas. 2. Simple appearing cysts in both kidneys.  No hydronephrosis. Electronically Signed   By: David  Martinique M.D.   On: 04/27/2016 09:29    Impression: 1. Recent rectal bleeding and dark stools by patient's report, not confirmed on this admission. Currently Hemoccult negative and hemoglobin higher than it was several years ago, non-microcytic.  I suspect he may have transiently bled from an internal hemorrhoid.  2. Prior history of large, highly dysplastic colonic polyp without subsequent surveillance  3. Medical conditions including history of lung cancer, dilated cardiomyopathy with atrial flutter, chronic kidney disease    Plan:The patient was made aware of his previous history of a large polyp, which he does not recall. He is aware that he has not had surveillance and therefore is at increased risk for getting colon cancer. He is aware that colonoscopy is available to evaluate him in this circumstance, but he does not want to have it at this time.  Considering his age and all his medical issues and the currently heme-negative stool and absence of microcytic anemia, I think this is a reasonable decision.   I did recommend, and he is agreeable, that he have screening Hemoccults performed. I would recommend a series of 3 standard Hemoccults or, alternatively, a fecal immunochemical test (FIT) at the present time, and repeated annually as long as he is a  potential colonoscopy candidate. If any of these were to come back positive, we would have to discuss in greater detail the appropriateness, risks, and benefits of him undergoing colonoscopy in his medical condition.  If you would like assistance in obtaining the fecal occult blood testing, please contact our office and Dr. Amedeo Plenty, who has seen him in the past, could help to arrange it.  Above discussed with Dr. Doylene Canard.  I will sign off at this time. Please call me if you have like to discuss his case in further detail or if you have any questions pertaining to his care.     Baker V  04/27/2016, 12:01 PM   Pager [phone] If no answer or after 5 PM call [phone]

## 2016-04-27 NOTE — Progress Notes (Signed)
Ref: Jovannie Ulibarri S, MD   Subjective:  Improved breathing with lasix use. Appreciate GI and renal consults. Mil LVH and 40-45 % EF and moderate MR, TR with severely dilated LA and RA on echocardiogram.  Objective:  Vital Signs in the last 24 hours: Temp:  [97.5 F (36.4 C)-98.6 F (37 C)] 98.6 F (37 C) (07/20 1348) Pulse Rate:  [70-86] 70 (07/20 1732) Cardiac Rhythm:  [-] Atrial flutter (07/20 0704) Resp:  [16-18] 16 (07/20 1348) BP: (97-140)/(74-109) 128/92 mmHg (07/20 1732) SpO2:  [97 %-100 %] 97 % (07/20 1732)  Physical Exam: BP Readings from Last 1 Encounters:  04/27/16 128/92    Wt Readings from Last 1 Encounters:  04/26/16 80.74 kg (178 lb)    Weight change:   HEENT: [place]/AT, Eyes-Brown, PERL, EOMI, Conjunctiva-Pink, Sclera-Non-icteric Neck: No JVD, No bruit, Trachea midline. Lungs:  Clear, Bilateral. Cardiac:  Regular rhythm, normal S1 and S2, no S3. III/VI systolic and II/VI diastolic murmur. Abdomen:  Soft, non-tender. Extremities:  No edema present. No cyanosis. No clubbing. CNS: AxOx3, Cranial nerves grossly intact, moves all 4 extremities. Right handed. Skin: Warm and dry.   Intake/Output from previous day:      Lab Results: BMET    Component Value Date/Time   NA 135 04/27/2016 0602   NA 134* 04/26/2016 1652   NA 140 07/08/2013 2350   K 3.4* 04/27/2016 0602   K 3.3* 04/26/2016 1652   K 3.6 07/08/2013 2350   CL 99* 04/27/2016 0602   CL 101 04/26/2016 1652   CL 107 07/08/2013 2350   CO2 25 04/27/2016 0602   CO2 23 04/26/2016 1652   CO2 21 07/08/2013 2350   GLUCOSE 101* 04/27/2016 0602   GLUCOSE 92 04/26/2016 1652   GLUCOSE 121* 07/08/2013 2350   BUN 88* 04/27/2016 0602   BUN 82* 04/26/2016 1652   BUN 40* 07/08/2013 2350   CREATININE 4.20* 04/27/2016 0602   CREATININE 4.53* 04/26/2016 1652   CREATININE 2.64* 07/08/2013 2350   CALCIUM 9.7 04/27/2016 0602   CALCIUM 9.5 04/26/2016 1652   CALCIUM 8.8 07/08/2013 2350   CALCIUM 9.8 03/10/2011 1657    GFRNONAA 12* 04/27/2016 0602   GFRNONAA 11* 04/26/2016 1652   GFRNONAA 22* 07/08/2013 2350   GFRAA 14* 04/27/2016 0602   GFRAA 13* 04/26/2016 1652   GFRAA 26* 07/08/2013 2350   CBC    Component Value Date/Time   WBC 4.5 04/27/2016 0602   WBC 4.3 05/25/2010 1036   RBC 4.23 04/27/2016 0602   RBC 3.87* 05/25/2010 1036   HGB 12.7* 04/27/2016 0602   HGB 11.9* 05/25/2010 1036   HCT 38.1* 04/27/2016 0602   HCT 34.6* 05/25/2010 1036   PLT 132* 04/27/2016 0602   PLT 160 05/25/2010 1036   MCV 90.1 04/27/2016 0602   MCV 89.5 05/25/2010 1036   MCH 30.0 04/27/2016 0602   MCH 30.9 05/25/2010 1036   MCHC 33.3 04/27/2016 0602   MCHC 34.5 05/25/2010 1036   RDW 14.4 04/27/2016 0602   RDW 14.5 05/25/2010 1036   LYMPHSABS 1.1 04/26/2016 1652   LYMPHSABS 1.0 05/25/2010 1036   MONOABS 0.8 04/26/2016 1652   MONOABS 0.4 05/25/2010 1036   EOSABS 0.2 04/26/2016 1652   EOSABS 0.1 05/25/2010 1036   BASOSABS 0.0 04/26/2016 1652   BASOSABS 0.0 05/25/2010 1036   HEPATIC Function Panel  Recent Labs  04/26/16 1652  PROT 6.8   HEMOGLOBIN A1C No components found for: HGA1C,  MPG CARDIAC ENZYMES Lab Results  Component Value Date  CKTOTAL 67 08/22/2010   CKMB 2.3 08/22/2010   TROPONINI <0.30 07/08/2013   TROPONINI 0.03        NO INDICATION OF MYOCARDIAL INJURY. 08/22/2010   TROPONINI 0.05        NO INDICATION OF MYOCARDIAL INJURY. 08/22/2010   BNP No results for input(s): PROBNP in the last 8760 hours. TSH  Recent Labs  04/26/16 2100  TSH 1.034   CHOLESTEROL No results for input(s): CHOL in the last 8760 hours.  Scheduled Meds: . allopurinol  100 mg Oral Daily  . amiodarone  200 mg Oral Daily  . atorvastatin  10 mg Oral QHS  . carvedilol  12.5 mg Oral BID WC  . ferrous sulfate  325 mg Oral BID WC  . potassium chloride  10 mEq Oral BID  . sodium chloride flush  3 mL Intravenous Q12H   Continuous Infusions:  PRN Meds:.acetaminophen,  nitroGLYCERIN  Assessment/Plan: Acute GI bleeding Chronic left heart systolic failure. Dilated cardiomyopathy Hypertension Hyperlipidemia COPD CKD, V Moderate MR Moderate TR.  Continue medical treatment.     Dixie Dials  MD  04/27/2016, 9:12 PM

## 2016-04-27 NOTE — Progress Notes (Signed)
Initial Nutrition Assessment  DOCUMENTATION CODES:   Not applicable  INTERVENTION:  -D/C Ensure, pt does not want -RD to continue to monitor  NUTRITION DIAGNOSIS:   Inadequate oral intake related to poor appetite (does not like food) as evidenced by per patient/family report.  GOAL:   Patient will meet greater than or equal to 90% of their needs  MONITOR:   PO intake, I & O's, Labs, Weight trends  REASON FOR ASSESSMENT:   Malnutrition Screening Tool    ASSESSMENT:   75 year old male presented with recurrent black stools and intermittent red blood per rectum x 1-2 weeks. No fever or abdominal pain. No nausea, vomiting or diarrhea. Patient has PMH of CKD, III, Dilated cardiomyopathy, hypertension, hyperlipidemia and atrial flutter.  Spoke with Rick Tyler at bedside. He endorses good appetite PTA. Denies issues chewing/swallowing or nausea/vomiting. States his appetite is good but his food was cold this morning so he did not eat it. He also has problems with the presentation of the food "powdered eggs." Patient does not want Ensure or any other ONS "I eat fine." He was a little agitated, waiting on GI to determine if he can discharge or not. Endorses 4# wt loss at this time. Per chart unable to verify, wt has been stable for approximately 1.5 years.  Nutrition-Focused physical exam completed. Findings are no fat depletion, no muscle depletion, and mild edema.   Labs and Medications reviewed: K 3.4  Diet Order:  Diet Heart Room service appropriate?: Yes; Fluid consistency:: Thin  Skin:  Reviewed, no issues  Last BM:  7/19  Height:   Ht Readings from Last 1 Encounters:  04/26/16 '6\' 1"'$  (1.854 m)    Weight:   Wt Readings from Last 1 Encounters:  04/26/16 178 lb (80.74 kg)    Ideal Body Weight:  83.63 kg  BMI:  Body mass index is 23.49 kg/(m^2).  Estimated Nutritional Needs:   Kcal:  1600-1800 calories  Protein:  80-100 grams  Fluid:  >/=  1.6L  EDUCATION NEEDS:   No education needs identified at this time  Rick Tyler. Rick Bale, MS, RD LDN Inpatient Clinical Dietitian Pager [phone]

## 2016-04-27 NOTE — Progress Notes (Signed)
  Echocardiogram 2D Echocardiogram has been performed.  Jennette Dubin 04/27/2016, 12:32 PM

## 2016-04-27 NOTE — Progress Notes (Signed)
Had conversation with patient regarding the Lasix ordered to be given by IV. Patient questioning lasix and stating he had already received a dose. After checking the charting saw that it had been given the night before. Then checked the home medication dosage and told Rick Tyler that the last dose he received was on the 19th and that his home dosage was actually higher that what Rick Tyler had to give. He then stated he doesn't always take his lasix only if he feels he needs it (if he has swelling). Stating "Rick Tyler am my own doctor".  After finally allowing me to give the lasix Rick Tyler told him he needed to use a urinal so that we could keep track of his output. He said well that should have already been in here, which Rick Tyler agreed and told him we would get one. He then said something to the effect that Rick Tyler am a mans man and Rick Tyler go to the bathroom. Rick Tyler told him that we would get the urinal to his room so that we could keep track of his output.

## 2016-04-27 NOTE — Care Management Obs Status (Signed)
[facility] NOTIFICATION   Patient Details  Name: Rick Tyler MRN: [id number] Date of Birth: 04-18-1941   Medicare Observation Status Notification Given:  Yes    Carles Collet, RN 04/27/2016, 11:14 AM

## 2016-04-27 NOTE — Care Management Note (Signed)
Case Management Note  Patient Details  Name: Rick Tyler MRN: [id number] Date of Birth: [birth year]  Subjective/Objective:                 Independent patient from home. Patient anticipates DC to home, denies any CM assistance at this time.   Action/Plan:  CM will continue to follow for DC planning.  Expected Discharge Date:                  Expected Discharge Plan:  Home/Self Care  In-House Referral:  NA  Discharge planning Services  CM Consult  Post Acute Care Choice:  NA Choice offered to:  NA  DME Arranged:  N/A DME Agency:  NA  HH Arranged:  NA HH Agency:  NA  Status of Service:  Completed, signed off  If discussed at [place] of Stay Meetings, dates discussed:    Additional Comments:  Carles Collet, RN 04/27/2016, 11:15 AM

## 2016-04-27 NOTE — Progress Notes (Signed)
Dr. Doylene Canard made aware that patient has not been compliant with using urinal. MD went to bedside to see patient.

## 2016-04-28 DIAGNOSIS — K922 Gastrointestinal hemorrhage, unspecified: Secondary | ICD-10-CM | POA: Diagnosis not present

## 2016-04-28 LAB — RENAL FUNCTION PANEL
Albumin: 3.3 g/dL — ABNORMAL LOW (ref 3.5–5.0)
Anion gap: 12 (ref 5–15)
BUN: 91 mg/dL — AB (ref 6–20)
CHLORIDE: 97 mmol/L — AB (ref 101–111)
CO2: 27 mmol/L (ref 22–32)
Calcium: 9.4 mg/dL (ref 8.9–10.3)
Creatinine, Ser: 4.54 mg/dL — ABNORMAL HIGH (ref 0.61–1.24)
GFR calc Af Amer: 13 mL/min — ABNORMAL LOW (ref >60)
GFR calc non Af Amer: 11 mL/min — ABNORMAL LOW (ref >60)
GLUCOSE: 101 mg/dL — AB (ref 65–99)
POTASSIUM: 3.4 mmol/L — AB (ref 3.5–5.1)
Phosphorus: 4.9 mg/dL — ABNORMAL HIGH (ref 2.5–4.6)
Sodium: 136 mmol/L (ref 135–145)

## 2016-04-28 LAB — PARATHYROID HORMONE, INTACT (NO CA): PTH: 117 pg/mL — AB (ref 15–65)

## 2016-04-28 LAB — CBC
HEMATOCRIT: 36.5 % — AB (ref 39.0–52.0)
Hemoglobin: 12.5 g/dL — ABNORMAL LOW (ref 13.0–17.0)
MCH: 30.6 pg (ref 26.0–34.0)
MCHC: 34.2 g/dL (ref 30.0–36.0)
MCV: 89.2 fL (ref 78.0–100.0)
Platelets: 133 10*3/uL — ABNORMAL LOW (ref 150–400)
RBC: 4.09 MIL/uL — ABNORMAL LOW (ref 4.22–5.81)
RDW: 14.4 % (ref 11.5–15.5)
WBC: 4.3 10*3/uL (ref 4.0–10.5)

## 2016-04-28 LAB — BASIC METABOLIC PANEL
Anion gap: 10 (ref 5–15)
BUN: 91 mg/dL — AB (ref 6–20)
CALCIUM: 9.5 mg/dL (ref 8.9–10.3)
CO2: 28 mmol/L (ref 22–32)
CREATININE: 4.55 mg/dL — AB (ref 0.61–1.24)
Chloride: 98 mmol/L — ABNORMAL LOW (ref 101–111)
GFR calc non Af Amer: 11 mL/min — ABNORMAL LOW (ref >60)
GFR, EST AFRICAN AMERICAN: 13 mL/min — AB (ref >60)
GLUCOSE: 103 mg/dL — AB (ref 65–99)
Potassium: 3.5 mmol/L (ref 3.5–5.1)
Sodium: 136 mmol/L (ref 135–145)

## 2016-04-28 MED ORDER — CALCIUM CARBONATE 1250 (500 CA) MG PO TABS: 1.0000 | ORAL_TABLET | Freq: Every day | ORAL | Status: DC

## 2016-04-28 MED ORDER — FERROUS SULFATE 325 (65 FE) MG PO TABS: 325.0000 mg | ORAL_TABLET | Freq: Two times a day (BID) | ORAL | Status: DC

## 2016-04-28 MED ORDER — POTASSIUM CHLORIDE ER 10 MEQ PO TBCR: 10.0000 meq | EXTENDED_RELEASE_TABLET | Freq: Every day | ORAL | Status: DC

## 2016-04-28 MED ORDER — ALLOPURINOL 100 MG PO TABS: 100.0000 mg | ORAL_TABLET | Freq: Every day | ORAL | Status: DC

## 2016-04-28 NOTE — Discharge Summary (Signed)
Physician Discharge Summary  Patient ID: Rick Tyler MRN: [id number] DOB/AGE: 1941-09-14 75 y.o.  Admit date: 04/26/2016 Discharge date: 04/28/2016  Admission Diagnoses: Acute GI bleeding Chronic left heart systolic failure. Dilated cardiomyopathy Hypertension Hyperlipidemia COPD CKD, III  Discharge Diagnoses:  Principal Problem: * Acute on chronic left systolic heart failure (HCC) * Active Problems:   Acute Rectal bleeding   Dilated cardiomyopathy   Iron deficiency anemia   Hypertension   Hyperlipidemia   COPD   CKD, V   Secondary Hyperparathyroidism   Moderate MR   Moderate TR   Chronic atrial fibrillation, CHA2DVASC score of 4/9.  Discharged Condition: fair  Hospital Course: 75 year old male presented with recurrent black stools and intermittent red blood per rectum x 1-2 weeks. No fever or abdominal pain. No nausea, vomiting or diarrhea. Patient has PMH of CKD, V, Dilated cardiomyopathy, hypertension, hyperlipidemia and atrial flutter/fibrillation. He is not a candidate for chronic anticoagulation due to recurrent GI bleed. He had GI and Renal consults and will follow with them on OP basis. He will see me in 1 week.   Consults: cardiology  Significant Diagnostic Studies: labs: Near normal CBC except Hgb of 12.2 and platelets count of 129 K. BMET showed mild hypokalemia and BUN of 88 and creatinine of 4.20. TSH was normal. BNP was elevated at 424.2.  Parathyroid hormone was 117 pg/ml. Uric acid was 9 mg/dL.  Occult blood test of stool was negative but bright red blood in commode post BM was observed by nurse. Iron was low at 42 mcg/dL.   EKG-Atrial fibrillation with non-specific T wave abnormality.  Chest X-ray: Mild congestive heart failure.  Ultrasound of abdomen: Normal Liver, pancrease and simple cysts in both kidneys.  Echocardiogram:  - Left ventricle: The cavity size was normal. There was mild concentric hypertrophy. Systolic function was mildly to  moderately reduced. The estimated ejection fraction was in the range of 40% to 45%. There is mild hypokinesis of the anterior myocardium. - Aortic valve: There was mild regurgitation. - Mitral valve: There was moderate regurgitation. - Left atrium: The atrium was massively dilated. - Right ventricle: Systolic function was moderately reduced. - Right atrium: The atrium was severely dilated. - Tricuspid valve: There was moderate regurgitation. - Pulmonary arteries: Systolic pressure was mildly increased. PA peak pressure: 31 mm Hg (S).  Treatments: cardiac meds: atorvastatin, furosemide, potassium, carvedilol, amiodarone and Bidil. Also iron pills and allopurinol.  Discharge Exam: Blood pressure 109/69, pulse 71, temperature 97.8 F (36.6 C), temperature source Oral, resp. rate 18, height '6\' 1"'$  (1.854 m), weight 77.701 kg (171 lb 4.8 oz), SpO2 100 %. HEENT: [place]/AT, Eyes-Brown, PERL, EOMI, Conjunctiva-Pink, Sclera-Non-icteric Neck: No JVD, No bruit, Trachea midline. Lungs: Clear, Bilateral. Cardiac: Regular rhythm, normal S1 and S2, no S3. III/VI systolic and II/VI diastolic murmur. Abdomen: Soft, non-tender. Extremities: No edema present. No cyanosis. No clubbing. CNS: AxOx3, Cranial nerves grossly intact, moves all 4 extremities. Right handed. Skin: Warm and dry.  Disposition: 01-Home or self care.     Medication List    TAKE these medications        allopurinol 100 MG tablet  Commonly known as:  ZYLOPRIM  Take 1 tablet (100 mg total) by mouth daily.     amiodarone 200 MG tablet  Commonly known as:  PACERONE  Take 200 mg by mouth daily.     atorvastatin 10 MG tablet  Commonly known as:  LIPITOR  Take 10 mg by mouth daily.  carvedilol 12.5 MG tablet  Commonly known as:  COREG  Take 12.5 mg by mouth 2 (two) times daily with a meal.     ferrous sulfate 325 (65 FE) MG tablet  Take 1 tablet (325 mg total) by mouth 2 (two) times daily with a meal.     furosemide 80 MG  tablet  Commonly known as:  LASIX  Take 80 mg by mouth 2 (two) times daily.     isosorbide-hydrALAZINE 20-37.5 MG tablet  Commonly known as:  BIDIL  Take 1 tablet by mouth 3 (three) times daily. Reported on 04/26/2016     nitroGLYCERIN 0.4 MG SL tablet  Commonly known as:  NITROSTAT  Place 0.4 mg under the tongue every 5 (five) minutes as needed.     potassium chloride 10 MEQ tablet  Commonly known as:  K-DUR  Take 1 tablet (10 mEq total) by mouth daily.           Follow-up Information    Follow up with [facility] S, MD. Schedule an appointment as soon as possible for a visit in 1 week.   Specialty:  Cardiology   Contact information:   [place] [address] [phone]       Follow up with [place],KELLIE A, MD. Schedule an appointment as soon as possible for a visit in 1 month.   Specialty:  Nephrology   Contact information:   [place] [address] [phone]       Signed: [name] 04/28/2016, 10:11 AM

## 2016-04-28 NOTE — Progress Notes (Signed)
Nsg Discharge Note  Admit Date:  04/26/2016 Discharge date: 04/28/2016   Rick Tyler to be D/C'd Home per MD order.  AVS completed.  Copy for chart, and copy for patient signed, and dated. Patient/caregiver able to verbalize understanding.  Discharge Medication:   Medication List    TAKE these medications        allopurinol 100 MG tablet  Commonly known as:  ZYLOPRIM  Take 1 tablet (100 mg total) by mouth daily.     amiodarone 200 MG tablet  Commonly known as:  PACERONE  Take 200 mg by mouth daily.     atorvastatin 10 MG tablet  Commonly known as:  LIPITOR  Take 10 mg by mouth daily.     calcium carbonate 1250 (500 Ca) MG tablet  Commonly known as:  OS-CAL - dosed in mg of elemental calcium  Take 1 tablet (500 mg of elemental calcium total) by mouth daily.     carvedilol 12.5 MG tablet  Commonly known as:  COREG  Take 12.5 mg by mouth 2 (two) times daily with a meal.     ferrous sulfate 325 (65 FE) MG tablet  Take 1 tablet (325 mg total) by mouth 2 (two) times daily with a meal.     furosemide 80 MG tablet  Commonly known as:  LASIX  Take 80 mg by mouth 2 (two) times daily.     isosorbide-hydrALAZINE 20-37.5 MG tablet  Commonly known as:  BIDIL  Take 1 tablet by mouth 3 (three) times daily. Reported on 04/26/2016     nitroGLYCERIN 0.4 MG SL tablet  Commonly known as:  NITROSTAT  Place 0.4 mg under the tongue every 5 (five) minutes as needed.     potassium chloride 10 MEQ tablet  Commonly known as:  K-DUR  Take 1 tablet (10 mEq total) by mouth daily.        Discharge Assessment: Filed Vitals:   04/27/16 2218 04/28/16 0448  BP: 119/83 109/69  Pulse: 72 71  Temp: 98.1 F (36.7 C) 97.8 F (36.6 C)  Resp: 18 18   Skin clean, dry and intact without evidence of skin break down, no evidence of skin tears noted. IV catheter discontinued intact. Site without signs and symptoms of complications - no redness or edema noted at insertion site, patient denies c/o  pain - only slight tenderness at site.  Dressing with slight pressure applied.  D/c Instructions-Education: Discharge instructions given to patient/family with verbalized understanding. D/c education completed with patient/family including follow up instructions, medication list, d/c activities limitations if indicated, with other d/c instructions as indicated by MD - patient able to verbalize understanding, all questions fully answered. Patient instructed to return to ED, call 911, or call MD for any changes in condition.  Patient escorted via [place], and D/C home via private auto.  [name], RN 04/28/2016 11:16 AM

## 2016-04-28 NOTE — Progress Notes (Signed)
   Subjective: Patient reports feeling very well. States he is very active - plays golf and walks his dog for miles. No symptoms of uremia.   Objective: Vital signs in last 24 hours: Filed Vitals:   04/27/16 1732 04/27/16 1732 04/27/16 2218 04/28/16 0448  BP: 128/92 128/92 119/83 109/69  Pulse: 70 70 72 71  Temp:   98.1 F (36.7 C) 97.8 F (36.6 C)  TempSrc:      Resp:   18 18  Height:      Weight:    171 lb 4.8 oz (77.701 kg)  SpO2:  97% 100% 100%   Physical Exam Constitutional: He is oriented to person, place, and time. He appears well-developed and well-nourished. No distress.  Eyes: EOM are normal.  Cardiovascular: Normal rate, regular rhythm and intact distal pulses. Exam reveals no gallop and no friction rub.  Pulmonary/Chest: Effort normal and breath sounds normal. No respiratory distress. He has no wheezes. He has no rales.  Abdominal: Soft. Bowel sounds are normal. He exhibits no distension. There is no tenderness. There is no guarding.  Musculoskeletal:  Trace pitting edema of bilateral lower extremities  Neurological: He is alert and oriented to person, place, and time.  Skin: Skin is warm and dry.   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 GFR was SCr was 2.6 in 2014. PTH 117. He is presenting for a GI bleed, however, his Hgb is at baseline and BP stable. As such, AKI is less likely to cause the rise in his creatinine. The rise in his creatinine is rather chronic secondary to worsening renal disease. He does have some dependant edema on exam despite taking Lasix daily. In addition, uric acid level is elevated. At present, patient reports doing well and not experiencing any symptoms of uremia, as such, no need for emergent dialysis. However, his CKD5 will progress in  subacute fashion and patient might require dialysis in the near future. He has agreed to follow-up with nephrology on an outpatient basis.   GI bleed Presenting with a 1-2 week history of recurrent melena and hematochezia. FOBT x 2 negative. Hgb at baseline. Bleeding thought to be due to internal hemorrhoids as per GI.  -Mgmt per GI  Iron deficiency anemia  Hgb 12.2 and MCV 91.7 on admission. Labs showing low iron and saturation ratio.  -Started Ferrous sulfate 325 mg BID  HTN BP stable. -Continue home medication Carvedilol 12.5 mg BID  CHF Echo from this admission showing LVEF 40-45% -Continue heart failure medications as per cardiology   Hx of A-flutter -Continue Amiodarone   HLD -Continue Lipitor 10 mg  Dispo: Patient has agreed to follow-up with nephrology on an outpatient basis.    Shela Leff, MD 04/28/2016, 11:58 AM Pager: [phone]  Patient seen and examined, agree with above note with above modifications.  Corliss Parish, MD 04/28/2016

## 2016-04-28 NOTE — Progress Notes (Signed)
Subjective:  Patient is up brushing his teeth- anxious to get out of hospital - not uremic- volume status OK Objective Vital signs in last 24 hours: Filed Vitals:   04/27/16 1732 04/27/16 1732 04/27/16 2218 04/28/16 0448  BP: 128/92 128/92 119/83 109/69  Pulse: 70 70 72 71  Temp:   98.1 F (36.7 C) 97.8 F (36.6 C)  TempSrc:      Resp:   18 18  Height:      Weight:    77.701 kg (171 lb 4.8 oz)  SpO2:  97% 100% 100%   Weight change: -3.039 kg (-6 lb 11.2 oz)  Intake/Output Summary (Last 24 hours) at 04/28/16 1159 Last data filed at 04/28/16 4268  Gross per 24 hour  Intake    120 ml  Output    750 ml  Net   -630 ml    Assessment/ Plan: Pt is a 75 y.o. yo male who was admitted on 04/26/2016 with  BRBPR- known history of CKD which has progressed with time  Assessment/Plan: 1.Renal-  Advanced CKD which has progressed with time.  He is clearly not uremic as is very active at this time.  I did explain to him his percentage of kidney function and how close he is to potentially becoming dialysis requiring.  I think he understands on some level and does agree to follow up with me as an OP to discuss further.  Ultrasound exam of kidneys just shows them to be small and echogenic so likely no reversibility to renal dysfunction 2. HTN/vol-  Blood pressure is well controlled on coreg and he is also on furosemide as OP- can fine tune when I see him in follow up  3. Anemia- really does not have significant anemia related to CKD- or this GIB-  no ESA needed but did start on oral iron 4. Secondary hyperparathyroidism- calc/phos WNL , PTH pending- I can follow as OP  5. Dispo- Renal is OK with patient discharge- he agrees to see me in the office for follow up- appt being made   [place]: Basic Metabolic Panel:  Recent Labs Lab 04/26/16 1652 04/27/16 0602 04/28/16 0615  NA 134* 135 136  136  K 3.3* 3.4* 3.4*  3.5  CL 101 99* 97*  98*  CO2 '23 25 27  28  '$ GLUCOSE 92  101* 101*  103*  BUN 82* 88* 91*  91*  CREATININE 4.53* 4.20* 4.54*  4.55*  CALCIUM 9.5 9.7 9.4  9.5  PHOS  --   --  4.9*   Liver Function Tests:  Recent Labs Lab 04/26/16 1652 04/28/16 0615  AST 39  --   ALT 36  --   ALKPHOS 57  --   BILITOT 0.5  --   PROT 6.8  --   ALBUMIN 3.6 3.3*   No results for input(s): LIPASE, AMYLASE in the last 168 hours. No results for input(s): AMMONIA in the last 168 hours. CBC:  Recent Labs Lab 04/26/16 1652 04/27/16 0602 04/28/16 0615  WBC 4.6 4.5 4.3  NEUTROABS 2.5  --   --   HGB 12.2* 12.7* 12.5*  HCT 37.5* 38.1* 36.5*  MCV 91.7 90.1 89.2  PLT 129* 132* 133*   Cardiac Enzymes: No results for input(s): CKTOTAL, CKMB, CKMBINDEX, TROPONINI in the last 168 hours. CBG: No results for input(s): GLUCAP in the last 168 hours.  Iron Studies:  Recent Labs  04/26/16 2100  IRON 42*  TIBC 241*  FERRITIN  376*   Studies/Results: X-ray Chest Pa And Lateral  04/26/2016  CLINICAL DATA:  CHF. EXAM: CHEST  2 VIEW COMPARISON:  Chest radiographs 12/29/2013 FINDINGS: Postsurgical change in the right hemithorax with stable volume loss and surgical clips at the right hilum. Cardiomediastinal contours are unchanged with tortuosity and atherosclerosis of the thoracic aorta. There is vascular congestion and probable mild perihilar edema. Suspect small pleural effusions. No pneumothorax. No acute osseous abnormalities, post-surgical/remote traumatic change in the right ribs. IMPRESSION: 1. Findings consistent with mild congestive heart failure. 2. Postsurgical change in the right hemithorax with volume loss, similar to prior. Electronically Signed   By: Jeb Levering M.D.   On: 04/26/2016 17:24   US Abdomen Complete  04/27/2016  CLINICAL DATA:  Recurrent black stools with intermittent hematochezia over the past 1-2 weeks, the patient denies abdominal pain, history of hypertension and renal insufficiency. EXAM: ABDOMEN ULTRASOUND COMPLETE COMPARISON:   Renal ultrasound of March 10, 2011 and CT scan of the chest of December twelfth 2012. FINDINGS: Gallbladder: No gallstones or wall thickening visualized. No sonographic Murphy sign noted by sonographer. Common bile duct: Diameter: 7.2 mm. No abnormal intraluminal echoes are observed. Liver: The hepatic echotexture is normal. There is no focal mass nor ductal dilation. IVC: No abnormality visualized. Pancreas: Evaluation of the pancreatic head and tail is limited. The visualized portions of the pancreatic body are grossly normal. Spleen: Size and appearance within normal limits. Right Kidney: Length: 9.4 cm. There is a lower pole simple appearing cyst measuring 1.3 cm in greatest dimension. The renal cortical echotexture remains lower than that of the adjacent liver. There is no hydronephrosis. Left Kidney: Length: 10.3 cm. The renal cortical echotexture is normal. There is a midpole cyst present which measures 1.1 cm in greatest dimension. Abdominal aorta: No aneurysm visualized. Other findings: There is no ascites. IMPRESSION: 1. No gallstones or evidence of acute cholecystitis. Normal appearance of the liver. Limited visualization of the pancreas. 2. Simple appearing cysts in both kidneys.  No hydronephrosis. Electronically Signed   By: David  Martinique M.D.   On: 04/27/2016 09:29   Medications: Infusions:    Scheduled Medications: . allopurinol  100 mg Oral Daily  . amiodarone  200 mg Oral Daily  . atorvastatin  10 mg Oral QHS  . calcium carbonate  1 tablet Oral Q breakfast  . carvedilol  12.5 mg Oral BID WC  . ferrous sulfate  325 mg Oral BID WC  . potassium chloride  10 mEq Oral BID  . sodium chloride flush  3 mL Intravenous Q12H    have reviewed scheduled and prn medications.  Physical Exam: General: alert, conversive- anxious Heart: RRR Lungs: clear Abdomen: soft, non tender Extremities: some edema    04/28/2016,11:59 AM

## 2016-10-03 ENCOUNTER — Ambulatory Visit (HOSPITAL_COMMUNITY)
Admission: EM | Admit: 2016-10-03 | Discharge: 2016-10-03 | Disposition: A | Discharge status: Home / Self Care | Payer: Medicare Other | Attending: Emergency Medicine | Admitting: Emergency Medicine

## 2016-10-03 ENCOUNTER — Encounter (HOSPITAL_COMMUNITY): Payer: Self-pay | Admitting: Emergency Medicine

## 2016-10-03 DIAGNOSIS — M10071 Idiopathic gout, right ankle and foot: Secondary | ICD-10-CM | POA: Diagnosis not present

## 2016-10-03 MED ORDER — COLCHICINE 0.6 MG PO CAPS: ORAL_CAPSULE | ORAL | 0 refills | Status: DC

## 2016-10-03 MED ORDER — IBUPROFEN 600 MG PO TABS: 600.0000 mg | ORAL_TABLET | Freq: Every day | ORAL | 0 refills | Status: DC | PRN

## 2016-10-03 NOTE — ED Triage Notes (Signed)
Reports significant pain in right great toe.  Poor historian.  Patient reports history of gout.  Patient took allopurinol today

## 2016-10-03 NOTE — ED Provider Notes (Signed)
[place]    CSN: [id number] Arrival date & time: 10/03/16  1133     History   Chief Complaint Chief Complaint  Patient presents with  . Foot Pain    HPI Rick Tyler is a 75 y.o. male.   HPI He is a 75 year old man here for evaluation of right great toe pain. He has had some chronic pain in the foot for at least several weeks. Over the last 2-3 days, he has had acute worsening of pain in the first MTP joint. He states it is painful to move it or touch it. It is also swollen. He is on allopurinol for gout. He did take this today. No known injury or trauma. No fevers.  He does have stage IV CKD.  He also reports intolerance to prednisone, stating it causes diarrhea.  Past Medical History:  Diagnosis Date  . A-fib ([place])   . Cancer ([place])   . CHF (congestive heart failure) ([place])   . Hemoptysis   . HTN (hypertension)   . Renal insufficiency     Patient Active Problem List   Diagnosis Date Noted  . Acute GI bleeding 04/26/2016  . Acute on chronic left systolic heart failure ([place]) 04/26/2016  . Hemoptysis   . Renal insufficiency   . COPD 01/21/2010  . 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 Essential hypertension 11/22/2009  . HEMOPTYSIS UNSPECIFIED 11/22/2009    Past Surgical History:  Procedure Laterality Date  . BRONCHOSCOPY  10/11/2009  . r vats  10/15/2008       Home Medications    Prior to Admission medications   Medication Sig Start Date End Date Taking? Authorizing Provider  allopurinol (ZYLOPRIM) 100 MG tablet Take 1 tablet (100 mg total) by mouth daily. 04/28/16  Yes Dixie Dials, MD  amiodarone (PACERONE) 200 MG tablet Take 200 mg by mouth daily.    Yes Historical Provider, MD  atorvastatin (LIPITOR) 10 MG tablet Take 10 mg by mouth daily.    Yes Historical Provider, MD  calcium carbonate (OS-CAL - DOSED IN MG OF ELEMENTAL CALCIUM) 1250 (500 Ca) MG tablet Take 1 tablet (500 mg of elemental calcium total) by mouth daily.  04/28/16  Yes Dixie Dials, MD  carvedilol (COREG) 12.5 MG tablet Take 12.5 mg by mouth 2 (two) times daily with a meal.    Yes Historical Provider, MD  ferrous sulfate 325 (65 FE) MG tablet Take 1 tablet (325 mg total) by mouth 2 (two) times daily with a meal. 04/28/16  Yes Dixie Dials, MD  furosemide (LASIX) 80 MG tablet Take 80 mg by mouth 2 (two) times daily.    Yes Historical Provider, MD  Colchicine 0.6 MG CAPS Take 2 tablets now and another tablet 1 hour later 10/03/16   Melony Overly, MD  ibuprofen (ADVIL,MOTRIN) 600 MG tablet Take 1 tablet (600 mg total) by mouth daily as needed for moderate pain. 10/03/16   Melony Overly, MD  isosorbide-hydrALAZINE (BIDIL) 20-37.5 MG per tablet Take 1 tablet by mouth 3 (three) times daily. Reported on 04/26/2016    Historical Provider, MD  nitroGLYCERIN (NITROSTAT) 0.4 MG SL tablet Place 0.4 mg under the tongue every 5 (five) minutes as needed.     Historical Provider, MD  potassium chloride (K-DUR) 10 MEQ tablet Take 1 tablet (10 mEq total) by mouth daily. Patient not taking: Reported on 10/03/2016 04/28/16   Dixie Dials, MD    Family History No family history on file.  Social History Social History  Substance Use Topics  . Smoking status: Former Research scientist (life sciences)  . Smokeless tobacco: Not on file     Comment: Smoked 1 PPD for 40 years; quit several years ago   . Alcohol use 3.6 oz/week    6 Cans of beer per week     Comment: not on a regular basis     Allergies   Warfarin sodium   Review of Systems Review of Systems As in history of present illness  Physical Exam Triage Vital Signs ED Triage Vitals  Enc Vitals Group     BP      Pulse      Resp      Temp      Temp src      SpO2      Weight      Height      Head Circumference      Peak Flow      Pain Score      Pain Loc      Pain Edu?      Excl. in [place]?    No data found.   Updated Vital Signs There were no vitals taken for this visit.  Visual Acuity Right Eye Distance:   Left  Eye Distance:   Bilateral Distance:    Right Eye Near:   Left Eye Near:    Bilateral Near:     Physical Exam  Constitutional: He is oriented to person, place, and time. He appears well-developed and well-nourished. No distress.  Cardiovascular: Normal rate.   Pulmonary/Chest: Effort normal.  Musculoskeletal:  Right foot: Brisk cap refill in all digits. He has redness, warmth and swelling to the first MTP joint. This is exquisitely tender to touch and passive movement. There is mild swelling elsewhere in the foot.  Neurological: He is alert and oriented to person, place, and time.     UC Treatments / Results  Labs (all labs ordered are listed, but only abnormal results are displayed) Labs Reviewed - No data to display  EKG  EKG Interpretation None       Radiology No results found.  Procedures Procedures (including critical care time)  Medications Ordered in UC Medications - No data to display   Initial Impression / Assessment and Plan / UC Course  I have reviewed the triage vital signs and the nursing notes.  Pertinent labs & imaging results that were available during my care of the patient were reviewed by me and considered in my medical decision making (see chart for details).  Clinical Course     Given intolerance to prednisone, will treat with colchicine for acute gout flare. Patient refused stronger pain medication. I provided a limited supply of ibuprofen 600 mg tablets. He is to use this sparingly for pain. Follow-up if not improving in 2 days.  Final Clinical Impressions(s) / UC Diagnoses   Final diagnoses:  Acute idiopathic gout involving toe of right foot    New Prescriptions New Prescriptions   COLCHICINE 0.6 MG CAPS    Take 2 tablets now and another tablet 1 hour later   IBUPROFEN (ADVIL,MOTRIN) 600 MG TABLET    Take 1 tablet (600 mg total) by mouth daily as needed for moderate pain.     Melony Overly, MD 10/03/16 1328

## 2016-10-03 NOTE — Discharge Instructions (Signed)
You have a gout flare. Take the colchicine as prescribed. You can take 1 ibuprofen tablet a day as needed for pain. Try to stick to Tylenol as much as you can. You should see improvement in 48 hours. Follow-up as needed.

## 2016-10-18 ENCOUNTER — Ambulatory Visit (INDEPENDENT_AMBULATORY_CARE_PROVIDER_SITE_OTHER): Payer: Medicare Other | Admitting: Podiatry

## 2016-10-18 ENCOUNTER — Encounter: Payer: Self-pay | Admitting: Podiatry

## 2016-10-18 ENCOUNTER — Ambulatory Visit (INDEPENDENT_AMBULATORY_CARE_PROVIDER_SITE_OTHER): Payer: Medicare Other

## 2016-10-18 VITALS — HR 77 | Resp 16 | Ht 73.0 in | Wt 179.0 lb

## 2016-10-18 DIAGNOSIS — M109 Gout, unspecified: Secondary | ICD-10-CM | POA: Diagnosis not present

## 2016-10-18 DIAGNOSIS — M21619 Bunion of unspecified foot: Secondary | ICD-10-CM

## 2016-10-18 DIAGNOSIS — M7751 Other enthesopathy of right foot: Secondary | ICD-10-CM

## 2016-10-18 DIAGNOSIS — M779 Enthesopathy, unspecified: Secondary | ICD-10-CM

## 2016-10-18 DIAGNOSIS — I999 Unspecified disorder of circulatory system: Secondary | ICD-10-CM

## 2016-10-18 MED ORDER — TRIAMCINOLONE ACETONIDE 10 MG/ML IJ SUSP
10.0000 mg | Freq: Once | INTRAMUSCULAR | Status: AC
  Administered 2016-10-18: 10 mg

## 2016-10-18 NOTE — Progress Notes (Signed)
   Subjective:    Patient ID: Rick Tyler, male    DOB: 03-02-1941, 76 y.o.   MRN: [id number]  HPI  Chief Complaint  Patient presents with  . Foot Pain    Right foot; Top of foot and beneath great toe x 1 month. Pt states that he went to urgent care in December and was dx with gout and given colchicine although it gave him diarrhea it helped with the pain.        Review of Systems     Objective:   Physical Exam        Assessment & Plan:

## 2016-10-18 NOTE — Progress Notes (Addendum)
Subjective:     Patient ID: HENSLEY TREAT, male   DOB: 09/16/1941, 76 y.o.   MRN: [id number]  HPI patient presents stating that I have had a lot of pain in my big toe joint of my right foot and that I was on some medication which is helped me a little bit but not significant   Review of Systems  All other systems reviewed and are negative.      Objective:   Physical Exam  Constitutional: He is oriented to person, place, and time.  Cardiovascular: Intact distal pulses.   Musculoskeletal: Normal range of motion.  Neurological: He is oriented to person, place, and time.  Skin: Skin is warm.  Nursing note and vitals reviewed.  neuro status found to be intact with significant diminishment of vascular both DP and PT pulses and some darkness to his lower legs with history of cramping at nighttime muscle strength adequate range of motion within normal limits with patient found to have inflammation and pain around the first MPJ right with fluid buildup around the joint surface. Patient has mild discomfort when I move the joint and is noted to have good range of motion and is well oriented     Assessment:     Probability for inflammatory capsulitis first MPJ right which may also have gout as and orientation    Plan:     H&P x-ray reviewed and careful injection of the first MPJ 3 motor Kenalog 5 mg Xylocaine administered and we will watch the results and decide what else may be appropriate for him also I do want to get blood flow done as I am concerned about his surgery status and I have scheduled him for this to be done  X-ray indicated that there is no indications of lysis or other calcification issue and it appears to be more of a soft tissue issue with structural bunion noted

## 2016-10-18 NOTE — Patient Instructions (Signed)

## 2016-11-01 ENCOUNTER — Ambulatory Visit (INDEPENDENT_AMBULATORY_CARE_PROVIDER_SITE_OTHER): Payer: Medicare Other | Admitting: Podiatry

## 2016-11-01 DIAGNOSIS — M109 Gout, unspecified: Secondary | ICD-10-CM

## 2016-11-01 DIAGNOSIS — M779 Enthesopathy, unspecified: Secondary | ICD-10-CM | POA: Diagnosis not present

## 2016-11-01 DIAGNOSIS — I999 Unspecified disorder of circulatory system: Secondary | ICD-10-CM

## 2016-11-01 DIAGNOSIS — M21619 Bunion of unspecified foot: Secondary | ICD-10-CM

## 2016-11-01 MED ORDER — TRIAMCINOLONE ACETONIDE 10 MG/ML IJ SUSP
10.0000 mg | Freq: Once | INTRAMUSCULAR | Status: AC
  Administered 2016-11-01: 10 mg

## 2016-11-01 NOTE — Progress Notes (Signed)
Subjective:     Patient ID: Rick Tyler, male   DOB: [birth year], 76 y.o.   MRN: [id number]  HPI patient presents stating he still has pain in his foot but it is improved but it feels like there is one area that still inflamed   Review of Systems     Objective:   Physical Exam Neurovascular status intact with continued inflammation dorsal and dorsal medial aspect first metatarsal head localized in nature with fluid buildup noted around the joint surface    Assessment:     Inflammatory capsulitis with possibility for gout-like symptomatology    Plan:     H&P conditions reviewed and careful dorsal injection administered 3 mg Kenalog 5 mg Xylocaine and advised on physical therapy anti-inflammatory treatment. Also we will follow a gout diet which was explained to patient

## 2016-11-03 ENCOUNTER — Other Ambulatory Visit: Payer: Self-pay | Admitting: Podiatry

## 2016-11-03 DIAGNOSIS — R0989 Other specified symptoms and signs involving the circulatory and respiratory systems: Secondary | ICD-10-CM

## 2016-11-10 ENCOUNTER — Ambulatory Visit (HOSPITAL_COMMUNITY)
Admission: RE | Admit: 2016-11-10 | Discharge: 2016-11-10 | Disposition: A | Discharge status: Home / Self Care | Payer: Medicare Other | Source: Ambulatory Visit | Attending: Cardiology | Admitting: Cardiology

## 2016-11-10 DIAGNOSIS — J449 Chronic obstructive pulmonary disease, unspecified: Secondary | ICD-10-CM | POA: Diagnosis not present

## 2016-11-10 DIAGNOSIS — E785 Hyperlipidemia, unspecified: Secondary | ICD-10-CM | POA: Diagnosis not present

## 2016-11-10 DIAGNOSIS — I10 Essential (primary) hypertension: Secondary | ICD-10-CM | POA: Diagnosis not present

## 2016-11-10 DIAGNOSIS — R0989 Other specified symptoms and signs involving the circulatory and respiratory systems: Secondary | ICD-10-CM | POA: Insufficient documentation

## 2016-11-29 ENCOUNTER — Encounter: Payer: Self-pay | Admitting: Podiatry

## 2016-11-29 ENCOUNTER — Ambulatory Visit (INDEPENDENT_AMBULATORY_CARE_PROVIDER_SITE_OTHER): Payer: Medicare Other

## 2016-11-29 ENCOUNTER — Ambulatory Visit (INDEPENDENT_AMBULATORY_CARE_PROVIDER_SITE_OTHER): Payer: Medicare Other | Admitting: Podiatry

## 2016-11-29 DIAGNOSIS — M21619 Bunion of unspecified foot: Secondary | ICD-10-CM | POA: Diagnosis not present

## 2016-11-29 DIAGNOSIS — M216X9 Other acquired deformities of unspecified foot: Secondary | ICD-10-CM

## 2016-11-29 DIAGNOSIS — M109 Gout, unspecified: Secondary | ICD-10-CM | POA: Diagnosis not present

## 2016-11-29 DIAGNOSIS — M79671 Pain in right foot: Secondary | ICD-10-CM | POA: Diagnosis not present

## 2016-11-29 DIAGNOSIS — L03031 Cellulitis of right toe: Secondary | ICD-10-CM | POA: Diagnosis not present

## 2016-11-29 DIAGNOSIS — L02611 Cutaneous abscess of right foot: Secondary | ICD-10-CM

## 2016-11-29 DIAGNOSIS — M779 Enthesopathy, unspecified: Secondary | ICD-10-CM

## 2016-11-29 MED ORDER — METHYLPREDNISOLONE 4 MG PO TBPK: ORAL_TABLET | ORAL | 0 refills | Status: DC

## 2016-11-29 NOTE — Progress Notes (Signed)
Subjective:     Patient ID: Rick Tyler, male   DOB: December 27, 1940, 76 y.o.   MRN: [id number]  HPI patient states that his pain has improved but still present if he wears tighter shoes and there still swelling around his big toe joint   Review of Systems     Objective:   Physical Exam Neurovascular status was intact with patient results of vascular studies indicating good flow with patient having swelling around the first MPJ right with continued fluid buildup around the joint surface itself. Patient does not have any proximal erythema edema or drainage noted    Assessment:     Probability for gout inflammatory condition but cannot rule out any kind of infective process    Plan:     H&P and re-x-ray the foot. Patient at this time had the area drains I was able to get out a caseous like material consistent with gout but I cannot rule out there may not be any bacterial infiltration and I did send for culture. I then went ahead and was able to remove most fluid from the joint and I applied compression advised on wider shoes and we'll placed on Medrol Dosepak. We should get results of culture within the next few days we will evaluate and see if any change needs to be done but it appears to be a problem of gout  X-rays indicate there is degeneration of the sesamoidal complex but I did not note a significant changes from previous visit and reviewed the case with Dr. Amalia Hailey

## 2016-12-02 LAB — CULTURE, ROUTINE-ABSCESS
GRAM STAIN: NONE SEEN
Gram Stain: NONE SEEN
Gram Stain: NONE SEEN
Organism ID, Bacteria: NO GROWTH

## 2016-12-13 ENCOUNTER — Ambulatory Visit (INDEPENDENT_AMBULATORY_CARE_PROVIDER_SITE_OTHER): Payer: Medicare Other | Admitting: Podiatry

## 2016-12-13 ENCOUNTER — Encounter: Payer: Self-pay | Admitting: Podiatry

## 2016-12-13 DIAGNOSIS — M779 Enthesopathy, unspecified: Secondary | ICD-10-CM | POA: Diagnosis not present

## 2016-12-13 DIAGNOSIS — M109 Gout, unspecified: Secondary | ICD-10-CM | POA: Diagnosis not present

## 2016-12-16 NOTE — Progress Notes (Signed)
Subjective:     Patient ID: Rick Tyler, male   DOB: June 02, 1941, 76 y.o.   MRN: [id number]  HPI patient presents stating that he's feeling quite a bit better with reduced swelling and pain   Review of Systems     Objective:   Physical Exam Neurovascular status intact negative Homans sign was noted with patient's right foot exhibiting diminished discomfort around the first MPJ with fluid buildup that's reduced with swelling still noted but much lesser degree    Assessment:     Reduction of inflammatory condition first MPJ right with probable gout    Plan:     Reviewed condition and gout and advised on the continuation of wider-type shoes anti-inflammatories at this time

## 2017-02-19 ENCOUNTER — Ambulatory Visit (INDEPENDENT_AMBULATORY_CARE_PROVIDER_SITE_OTHER): Payer: Medicare Other

## 2017-02-19 ENCOUNTER — Ambulatory Visit (INDEPENDENT_AMBULATORY_CARE_PROVIDER_SITE_OTHER): Payer: Medicare Other | Admitting: Podiatry

## 2017-02-19 DIAGNOSIS — M779 Enthesopathy, unspecified: Secondary | ICD-10-CM | POA: Diagnosis not present

## 2017-02-19 DIAGNOSIS — M109 Gout, unspecified: Secondary | ICD-10-CM

## 2017-02-19 MED ORDER — TRIAMCINOLONE ACETONIDE 10 MG/ML IJ SUSP: 10.0000 mg | Freq: Once | INTRAMUSCULAR | Status: DC

## 2017-02-20 LAB — ANA, IFA COMPREHENSIVE PANEL
Anti Nuclear Antibody(ANA): POSITIVE — AB
ENA SM AB SER-ACNC: NEGATIVE
SM/RNP: NEGATIVE
SSA (Ro) (ENA) Antibody, IgG: <1
SSB (La) (ENA) Antibody, IgG: <1
Scleroderma (Scl-70) (ENA) Antibody, IgG: <1
ds DNA Ab: <1 IU/mL

## 2017-02-20 LAB — RHEUMATOID FACTOR: Rhuematoid fact SerPl-aCnc: <14 IU/mL (ref <14)

## 2017-02-20 LAB — URIC ACID: URIC ACID, SERUM: 7.6 mg/dL (ref 4.0–8.0)

## 2017-02-20 LAB — C-REACTIVE PROTEIN: CRP: 33.6 mg/L — AB (ref <8.0)

## 2017-02-20 LAB — ANTI-NUCLEAR AB-TITER (ANA TITER): ANA Titer 1: 1:80 {titer} — ABNORMAL HIGH

## 2017-02-20 LAB — SEDIMENTATION RATE: Sed Rate: 11 mm/hr (ref 0–20)

## 2017-02-20 NOTE — Progress Notes (Signed)
Subjective:    Patient ID: Rick Tyler, male   DOB: 76 y.o.   MRN: [id number]   HPI patient states he was doing well but then he went to [place] for a golf tournament and overdid it and developed inflammation and pain around both big toe joints with the left first followed by the right    ROS      Objective:  Physical Exam  Neurovascular status intact negative Homans sign was noted with acute inflammation of the first MPJ left over right with fluid buildup and pain    Assessment:    Inflammatory capsulitis with possibility of gout of a systemic nature creating the inflammatory process     Plan:    H&P x-rays reviewed and today I injected around the first MPJ bilateral 3 mg Kenalog 5 mg Xylocaine advised on heat therapy wider shoes and reappoint to recheck  X-rays indicate mild osteoporosis with structural bunion of and mild nature with soft tissue inflammation noted but no indications of ostial lysis

## 2017-02-23 ENCOUNTER — Encounter (HOSPITAL_COMMUNITY): Payer: Self-pay | Admitting: *Deleted

## 2017-02-23 ENCOUNTER — Observation Stay (HOSPITAL_COMMUNITY)
Admission: AD | Admit: 2017-02-23 | Discharge: 2017-02-24 | Discharge status: Against Medical Advice | Payer: Medicare Other | Source: Ambulatory Visit | Attending: Cardiovascular Disease | Admitting: Cardiovascular Disease

## 2017-02-23 DIAGNOSIS — R600 Localized edema: Secondary | ICD-10-CM | POA: Diagnosis present

## 2017-02-23 DIAGNOSIS — E876 Hypokalemia: Secondary | ICD-10-CM | POA: Diagnosis not present

## 2017-02-23 DIAGNOSIS — I130 Hypertensive heart and chronic kidney disease with heart failure and stage 1 through stage 4 chronic kidney disease, or unspecified chronic kidney disease: Principal | ICD-10-CM | POA: Insufficient documentation

## 2017-02-23 DIAGNOSIS — N184 Chronic kidney disease, stage 4 (severe): Secondary | ICD-10-CM | POA: Insufficient documentation

## 2017-02-23 DIAGNOSIS — I10 Essential (primary) hypertension: Secondary | ICD-10-CM | POA: Diagnosis present

## 2017-02-23 DIAGNOSIS — Z87891 Personal history of nicotine dependence: Secondary | ICD-10-CM | POA: Insufficient documentation

## 2017-02-23 DIAGNOSIS — Z859 Personal history of malignant neoplasm, unspecified: Secondary | ICD-10-CM | POA: Diagnosis not present

## 2017-02-23 DIAGNOSIS — I4892 Unspecified atrial flutter: Secondary | ICD-10-CM | POA: Insufficient documentation

## 2017-02-23 DIAGNOSIS — I5023 Acute on chronic systolic (congestive) heart failure: Secondary | ICD-10-CM | POA: Insufficient documentation

## 2017-02-23 DIAGNOSIS — E785 Hyperlipidemia, unspecified: Secondary | ICD-10-CM | POA: Diagnosis not present

## 2017-02-23 DIAGNOSIS — I2510 Atherosclerotic heart disease of native coronary artery without angina pectoris: Secondary | ICD-10-CM | POA: Insufficient documentation

## 2017-02-23 DIAGNOSIS — Z79899 Other long term (current) drug therapy: Secondary | ICD-10-CM | POA: Diagnosis not present

## 2017-02-23 DIAGNOSIS — Z888 Allergy status to other drugs, medicaments and biological substances status: Secondary | ICD-10-CM | POA: Diagnosis not present

## 2017-02-23 DIAGNOSIS — I420 Dilated cardiomyopathy: Secondary | ICD-10-CM | POA: Diagnosis not present

## 2017-02-23 DIAGNOSIS — I509 Heart failure, unspecified: Secondary | ICD-10-CM

## 2017-02-23 LAB — CBC WITH DIFFERENTIAL/PLATELET
Basophils Absolute: 0.1 10*3/uL (ref 0.0–0.1)
Basophils Relative: 1 %
EOS ABS: 0.2 10*3/uL (ref 0.0–0.7)
Eosinophils Relative: 4 %
HEMATOCRIT: 36.5 % — AB (ref 39.0–52.0)
Hemoglobin: 12.3 g/dL — ABNORMAL LOW (ref 13.0–17.0)
LYMPHS ABS: 1.1 10*3/uL (ref 0.7–4.0)
LYMPHS PCT: 18 %
MCH: 31.1 pg (ref 26.0–34.0)
MCHC: 33.7 g/dL (ref 30.0–36.0)
MCV: 92.2 fL (ref 78.0–100.0)
MONOS PCT: 11 %
Monocytes Absolute: 0.7 10*3/uL (ref 0.1–1.0)
Neutro Abs: 4 10*3/uL (ref 1.7–7.7)
Neutrophils Relative %: 66 %
Platelets: 179 10*3/uL (ref 150–400)
RBC: 3.96 MIL/uL — AB (ref 4.22–5.81)
RDW: 14.8 % (ref 11.5–15.5)
WBC: 6.1 10*3/uL (ref 4.0–10.5)

## 2017-02-23 LAB — COMPREHENSIVE METABOLIC PANEL
ALBUMIN: 3.4 g/dL — AB (ref 3.5–5.0)
ALT: UNDETERMINED U/L (ref 17–63)
AST: UNDETERMINED U/L (ref 15–41)
Alkaline Phosphatase: 71 U/L (ref 38–126)
Anion gap: 14 (ref 5–15)
BUN: 57 mg/dL — ABNORMAL HIGH (ref 6–20)
CHLORIDE: 109 mmol/L (ref 101–111)
CO2: 18 mmol/L — AB (ref 22–32)
Calcium: 9.1 mg/dL (ref 8.9–10.3)
Creatinine, Ser: 2.84 mg/dL — ABNORMAL HIGH (ref 0.61–1.24)
GFR calc Af Amer: 23 mL/min — ABNORMAL LOW (ref >60)
GFR calc non Af Amer: 20 mL/min — ABNORMAL LOW (ref >60)
GLUCOSE: 110 mg/dL — AB (ref 65–99)
POTASSIUM: 2.8 mmol/L — AB (ref 3.5–5.1)
Sodium: 141 mmol/L (ref 135–145)
Total Bilirubin: UNDETERMINED mg/dL (ref 0.3–1.2)
Total Protein: 6.8 g/dL (ref 6.5–8.1)

## 2017-02-23 LAB — TROPONIN I: Troponin I: 0.03 ng/mL (ref <0.03)

## 2017-02-23 LAB — BRAIN NATRIURETIC PEPTIDE: B Natriuretic Peptide: 1512.4 pg/mL — ABNORMAL HIGH (ref 0.0–100.0)

## 2017-02-23 MED ORDER — LATANOPROST 0.005 % OP SOLN
1.0000 [drp] | Freq: Every day | OPHTHALMIC | Status: DC
  Administered 2017-02-23 – 2017-02-24 (×2): 1 [drp] via OPHTHALMIC
  Filled 2017-02-23 (×2): qty 2.5

## 2017-02-23 MED ORDER — POTASSIUM CHLORIDE CRYS ER 20 MEQ PO TBCR
20.0000 meq | EXTENDED_RELEASE_TABLET | Freq: Once | ORAL | Status: AC
  Administered 2017-02-23: 20 meq via ORAL
  Filled 2017-02-23: qty 1

## 2017-02-23 MED ORDER — BENZONATATE 100 MG PO CAPS
100.0000 mg | ORAL_CAPSULE | Freq: Three times a day (TID) | ORAL | Status: DC | PRN
  Administered 2017-02-23 – 2017-02-24 (×3): 100 mg via ORAL
  Filled 2017-02-23 (×3): qty 1

## 2017-02-23 MED ORDER — HEPARIN SODIUM (PORCINE) 5000 UNIT/ML IJ SOLN
5000.0000 [IU] | Freq: Three times a day (TID) | INTRAMUSCULAR | Status: DC
  Filled 2017-02-23: qty 1

## 2017-02-23 MED ORDER — CARVEDILOL 12.5 MG PO TABS
12.5000 mg | ORAL_TABLET | Freq: Two times a day (BID) | ORAL | Status: DC
  Administered 2017-02-23 – 2017-02-24 (×2): 12.5 mg via ORAL
  Filled 2017-02-23 (×2): qty 1

## 2017-02-23 MED ORDER — SODIUM CHLORIDE 0.9% FLUSH
3.0000 mL | Freq: Two times a day (BID) | INTRAVENOUS | Status: DC
  Administered 2017-02-23 – 2017-02-24 (×3): 3 mL via INTRAVENOUS

## 2017-02-23 MED ORDER — AMIODARONE HCL 200 MG PO TABS
200.0000 mg | ORAL_TABLET | Freq: Every day | ORAL | Status: DC
  Administered 2017-02-24: 200 mg via ORAL
  Filled 2017-02-23: qty 1

## 2017-02-23 MED ORDER — FUROSEMIDE 10 MG/ML IJ SOLN
80.0000 mg | Freq: Two times a day (BID) | INTRAMUSCULAR | Status: DC
  Administered 2017-02-23 – 2017-02-24 (×3): 80 mg via INTRAVENOUS
  Filled 2017-02-23 (×3): qty 8

## 2017-02-23 MED ORDER — ONDANSETRON HCL 4 MG/2ML IJ SOLN: 4.0000 mg | Freq: Four times a day (QID) | INTRAMUSCULAR | Status: DC | PRN

## 2017-02-23 MED ORDER — ALLOPURINOL 100 MG PO TABS
100.0000 mg | ORAL_TABLET | Freq: Every day | ORAL | Status: DC
Start: 2017-02-23 — End: 2017-02-24
  Administered 2017-02-23 – 2017-02-24 (×2): 100 mg via ORAL
  Filled 2017-02-23 (×2): qty 1

## 2017-02-23 MED ORDER — ACETAMINOPHEN 325 MG PO TABS: 650.0000 mg | ORAL_TABLET | ORAL | Status: DC | PRN

## 2017-02-23 MED ORDER — SODIUM CHLORIDE 0.9 % IV SOLN: 250.0000 mL | INTRAVENOUS | Status: DC | PRN

## 2017-02-23 MED ORDER — SODIUM CHLORIDE 0.9% FLUSH: 3.0000 mL | INTRAVENOUS | Status: DC | PRN

## 2017-02-23 MED ORDER — NITROGLYCERIN 0.4 MG SL SUBL
0.4000 mg | SUBLINGUAL_TABLET | SUBLINGUAL | Status: DC | PRN
  Administered 2017-02-24: 0.4 mg via SUBLINGUAL
  Filled 2017-02-23: qty 1

## 2017-02-23 NOTE — Progress Notes (Signed)
K+ = 2.8.  Dr. Doylene Canard notified and orders received.

## 2017-02-23 NOTE — Progress Notes (Signed)
Patient states that he will be leaving hospital at 1000 am 5/19 and will leave AMA, Informed patient that he has a 2D echo that needs to be done, but I am not sure when this test will done. I asked if he would at least stay until this test is complete and he is seen by the MD. He stated that he has things that need to be done and he is not promising anything at this time. Informed Agricultural consultant and she spoke with him he is now agreeing to stay until 1400. Arthor Captain LPN

## 2017-02-23 NOTE — H&P (Signed)
Referring Physician:  [name] is an 76 y.o. male.                       Chief Complaint: Leg edema  HPI: 76 year old male with PMH of CKD IV, dilated cardiomyopathy, hypertension, hyperlipidemia GI bleed and atrial flutter has progressively worening leg edema in spite of increasing lasix to 80 mg. 3 times daily. He denies chest pain and appears dyspneic but denies shortness of breath.  Past Medical History:  Diagnosis Date  . A-fib ([place])   . Cancer ([place])   . CHF (congestive heart failure) ([place])   . Hemoptysis   . HTN (hypertension)   . Renal insufficiency       Past Surgical History:  Procedure Laterality Date  . BRONCHOSCOPY  10/11/2009  . r vats  10/15/2008    History reviewed. No pertinent family history. Social History:  reports that he has quit smoking. He has never used smokeless tobacco. He reports that he drinks about 3.6 oz of alcohol per week . He reports that he does not use drugs.  Allergies:  Allergies  Allergen Reactions  . Warfarin Sodium Other (See Comments)    REACTION: Free Bleeder    Facility-Administered Medications Prior to Admission  Medication Dose Route Frequency Provider Last Rate Last Dose  . [DISCONTINUED] triamcinolone acetonide (KENALOG) 10 MG/ML injection 10 mg  10 mg Other Once Wallene Huh, DPM       Medications Prior to Admission  Medication Sig Dispense Refill  . allopurinol (ZYLOPRIM) 100 MG tablet Take 1 tablet (100 mg total) by mouth daily. 30 tablet 3  . amiodarone (PACERONE) 200 MG tablet Take 200 mg by mouth daily.     . carvedilol (COREG) 12.5 MG tablet Take 12.5 mg by mouth 2 (two) times daily with a meal.     . furosemide (LASIX) 80 MG tablet Take 80 mg by mouth 3 (three) times daily.     Marland Kitchen latanoprost (XALATAN) 0.005 % ophthalmic solution Place 1 drop into both eyes daily.  12  . nitroGLYCERIN (NITROSTAT) 0.4 MG SL tablet Place 0.4 mg under the tongue every 5 (five) minutes as needed for chest pain.     . calcium  carbonate (OS-CAL - DOSED IN MG OF ELEMENTAL CALCIUM) 1250 (500 Ca) MG tablet Take 1 tablet (500 mg of elemental calcium total) by mouth daily. (Patient not taking: Reported on 02/23/2017) 30 tablet 3  . Colchicine 0.6 MG CAPS Take 2 tablets now and another tablet 1 hour later (Patient not taking: Reported on 02/23/2017) 3 capsule 0  . ferrous sulfate 325 (65 FE) MG tablet Take 1 tablet (325 mg total) by mouth 2 (two) times daily with a meal. (Patient not taking: Reported on 02/23/2017) 60 tablet 3  . ibuprofen (ADVIL,MOTRIN) 600 MG tablet Take 1 tablet (600 mg total) by mouth daily as needed for moderate pain. (Patient not taking: Reported on 02/23/2017) 10 tablet 0  . potassium chloride (K-DUR) 10 MEQ tablet Take 1 tablet (10 mEq total) by mouth daily. (Patient not taking: Reported on 02/23/2017) 30 tablet 3  . [DISCONTINUED] methylPREDNISolone (MEDROL DOSEPAK) 4 MG TBPK tablet follow package directions (Patient not taking: Reported on 02/23/2017) 21 tablet 0    No results found for this or any previous visit (from the past 48 hour(s)). No results found.  Review Of Systems Constitutional: No fever, chills, weight loss or gain. Eyes: No vision change, wears glasses. No discharge or  pain. Ears: No hearing loss, No tinnitus. Respiratory: No asthma, COPD, pneumonias. Positive shortness of breath. No hemoptysis. Cardiovascular: Positive chest pain, palpitation, leg edema. Gastrointestinal: No nausea, vomiting, diarrhea, constipation. Positive GI bleed. No hepatitis. Genitourinary: No dysuria, hematuria, kidney stone. No incontinance. Neurological: No headache, stroke, seizures.  Psychiatry: No psych facility admission for anxiety, depression, suicide. No detox. Skin: No rash. Musculoskeletal: Positive joint pain, no fibromyalgia. No neck pain, back pain. Lymphadenopathy: No lymphadenopathy. Hematology: No anemia or easy bruising.   Blood pressure (!) 144/85, pulse 99, temperature 97.5 F (36.4 C),  temperature source Oral, resp. rate 18, height '6\' 1"'$  (1.854 m), weight 80.7 kg (178 lb), SpO2 99 %. Body mass index is 23.48 kg/m. General appearance: alert, cooperative, appears stated age and mild distress Head: Normocephalic, atraumatic. Eyes: Brown eyes, pale pink conjunctiva, corneas clear. PERRL, EOM's intact. Neck: No adenopathy, no carotid bruit, + JVD, supple, symmetrical, trachea midline and thyroid not enlarged. Resp: Crackles at bases to auscultation bilaterally. Cardio: Regular rate and rhythm, S1, S2 normal, II/VI systolic murmur, no click, rub or gallop GI: Soft, non-tender; bowel sounds normal; no organomegaly. Extremities: 2 + edema up to knees, bilateral, no cyanosis or clubbing. Skin: Warm and dry.  Neurologic: Alert and oriented X 3, normal strength. Normal coordination and gait.  Assessment/Plan Bilateral leg edema R/O CHF, systolic Dilated cardiomyopathy, non-ischemic CAD COPD CKD IV/V Hypertension Hyperlipidemia Paroxysmal atrial flutter H/O GI bleed  IV lasix/Echocardiogram/Renal function/ Home medications.  Rick Riddle, MD  02/23/2017, 12:36 PM

## 2017-02-23 NOTE — Progress Notes (Signed)
CRITICAL VALUE ALERT  Critical value received:  Troponin 0.03  Date of notification:  02/23/17  Time of notification:  5701  Critical value read back:  yes  Nurse who received alert:  Glean Hess  MD notified (1st page):  Doylene Canard  Time of first page:  1746  MD notified (2nd page):  Time of second page:  Responding MD:    Time MD responded:

## 2017-02-23 NOTE — Progress Notes (Signed)
Pt continues to refuse heparin SQ.  MD aware.

## 2017-02-24 ENCOUNTER — Observation Stay (HOSPITAL_COMMUNITY): Payer: Medicare Other

## 2017-02-24 ENCOUNTER — Other Ambulatory Visit: Payer: Self-pay

## 2017-02-24 DIAGNOSIS — I420 Dilated cardiomyopathy: Secondary | ICD-10-CM | POA: Diagnosis not present

## 2017-02-24 DIAGNOSIS — N184 Chronic kidney disease, stage 4 (severe): Secondary | ICD-10-CM | POA: Diagnosis not present

## 2017-02-24 DIAGNOSIS — I5023 Acute on chronic systolic (congestive) heart failure: Secondary | ICD-10-CM | POA: Diagnosis not present

## 2017-02-24 DIAGNOSIS — I130 Hypertensive heart and chronic kidney disease with heart failure and stage 1 through stage 4 chronic kidney disease, or unspecified chronic kidney disease: Secondary | ICD-10-CM | POA: Diagnosis not present

## 2017-02-24 LAB — TROPONIN I

## 2017-02-24 LAB — BASIC METABOLIC PANEL
Anion gap: 10 (ref 5–15)
BUN: 56 mg/dL — ABNORMAL HIGH (ref 6–20)
CHLORIDE: 111 mmol/L (ref 101–111)
CO2: 21 mmol/L — ABNORMAL LOW (ref 22–32)
Calcium: 9.2 mg/dL (ref 8.9–10.3)
Creatinine, Ser: 2.68 mg/dL — ABNORMAL HIGH (ref 0.61–1.24)
GFR calc non Af Amer: 21 mL/min — ABNORMAL LOW (ref >60)
GFR, EST AFRICAN AMERICAN: 25 mL/min — AB (ref >60)
Glucose, Bld: 117 mg/dL — ABNORMAL HIGH (ref 65–99)
POTASSIUM: 3 mmol/L — AB (ref 3.5–5.1)
SODIUM: 142 mmol/L (ref 135–145)

## 2017-02-24 MED ORDER — POTASSIUM CHLORIDE CRYS ER 20 MEQ PO TBCR
20.0000 meq | EXTENDED_RELEASE_TABLET | Freq: Two times a day (BID) | ORAL | Status: DC
  Administered 2017-02-24: 20 meq via ORAL
  Filled 2017-02-24: qty 1

## 2017-02-24 MED ORDER — POTASSIUM CHLORIDE CRYS ER 20 MEQ PO TBCR
20.0000 meq | EXTENDED_RELEASE_TABLET | Freq: Once | ORAL | Status: AC
  Administered 2017-02-24: 20 meq via ORAL
  Filled 2017-02-24: qty 1

## 2017-02-24 MED ORDER — POTASSIUM CHLORIDE CRYS ER 20 MEQ PO TBCR: 20.0000 meq | EXTENDED_RELEASE_TABLET | Freq: Once | ORAL | Status: DC

## 2017-02-24 NOTE — Progress Notes (Signed)
Patient leaving against medical advice.  IV and telemetry removed.  Dr. Terrence Dupont is aware.

## 2017-02-24 NOTE — Progress Notes (Signed)
Subjective:  Patient denies any chest pain complains of shortness of breath with minimal exertion and leg swelling. States had bleeding more than 10 years ago and absolutely do not want to consider chronic anticoagulation. States wants to sign out [place] at 2:00 this afternoon.  Objective:  Vital Signs in the last 24 hours: Temp:  [97.6 F (36.4 C)-98.5 F (36.9 C)] 97.6 F (36.4 C) (05/19 9326) Pulse Rate:  [87-96] 87 (05/19 0638) Resp:  [20] 20 (05/19 0638) BP: (144-152)/(87-102) 149/98 (05/19 7124) SpO2:  [97 %-100 %] 100 % (05/19 5809) Weight:  [78.8 kg (173 lb 12.8 oz)] 78.8 kg (173 lb 12.8 oz) (05/19 9833)  Intake/Output from previous day: 05/18 0701 - 05/19 0700 In: 830 [P.O.:830] Out: 575 [Urine:575] Intake/Output from this shift: Total I/O In: 240 [P.O.:240] Out: -   Physical Exam: Neck: JVD - 8 cm above sternal notch, no adenopathy, no carotid bruit and supple, symmetrical, trachea midline Lungs: Bibasilar rales noted Heart: Irregularly irregular S1-S2 normal there is 2/6 systolic murmur and S3 gallop noted Abdomen: soft, non-tender; bowel sounds normal; no masses,  no organomegaly Extremities: No clubbing cyanosis 2+ edema noted  Lab Results:  Recent Labs  02/23/17 1554  WBC 6.1  HGB 12.3*  PLT 179    Recent Labs  02/23/17 1554 02/24/17 0031  NA 141 142  K 2.8* 3.0*  CL 109 111  CO2 18* 21*  GLUCOSE 110* 117*  BUN 57* 56*  CREATININE 2.84* 2.68*    Recent Labs  02/23/17 1937 02/24/17 0031  TROPONINI <0.03 <0.03   Hepatic Function Panel  Recent Labs  02/23/17 1554  PROT 6.8  ALBUMIN 3.4*  AST QUANTITY NOT SUFFICIENT, UNABLE TO PERFORM TEST  ALT QUANTITY NOT SUFFICIENT, UNABLE TO PERFORM TEST  ALKPHOS 71  BILITOT QUANTITY NOT SUFFICIENT, UNABLE TO PERFORM TEST   No results for input(s): CHOL in the last 72 hours. No results for input(s): PROTIME in the last 72 hours.  Imaging: Imaging results have been reviewed and  Dg Chest 2 View  Result Date: 02/24/2017 CLINICAL DATA:  Congestive heart failure, swelling, cough. EXAM: CHEST  2 VIEW COMPARISON:  04/26/2016. FINDINGS: Trachea is deviated somewhat to the right, as before. Heart is enlarged. Thoracic aorta is calcified. Biapical pleuroparenchymal scarring, right greater than left. Right apical hazy opacification. Mild interstitial prominence and indistinctness. Pleuroparenchymal scarring at the base of the right hemithorax. IMPRESSION: 1. Right apical hazy opacification may be due to pneumonia. Followup PA and lateral chest X-ray is recommended in 3-4 weeks following trial of antibiotic therapy to ensure resolution and exclude underlying malignancy. 2. Mild prominence of the interstitial markings may be chronic. Difficult to exclude pulmonary edema. Electronically Signed   By: Lorin Picket M.D.   On: 02/24/2017 08:42    Cardiac Studies:  Assessment/Plan:  Decompensated systolic congestive heart failure Nonischemic dilated cardiomyopathy Valvular heart disease Chronic atrial flutter CAD COPD Hypertension Chronic kidney disease stage IV Hyperlipidemia History of GI bleed and remote past Hypokalemia Plan Replace K Discussed at length with patient regarding chronic anticoagulation which he absolutely refuses and wanted to leave AMA later this afternoon. Patient understands the risk and benefit.  LOS: 0 days    Charolette Forward 02/24/2017, 10:16 AM

## 2017-02-24 NOTE — Progress Notes (Signed)
Patient complained of left sided chest pain and said it was a "cold feeling" and he needed a nitro.  EKG obtained, VSS, 1 nitro given with relief.  Dr. Terrence Dupont made aware.  Patient still says he is leaving at 2pm.  Will continue to monitor.

## 2017-02-26 LAB — ECHOCARDIOGRAM COMPLETE
AVLVOTPG: 3 mmHg
Area-P 1/2: 3.79 cm2
CHL CUP MV M VEL: 63.1
CHL CUP STROKE VOLUME: 61 mL
CHL CUP TV REG PEAK VELOCITY: 334 cm/s
EERAT: 8.98
FS: 37 % (ref 28–44)
HEIGHTINCHES: 73 in
IVS/LV PW RATIO, ED: 0.92
LA ID, A-P, ES: 58 mm
LA vol index: 50.7 mL/m2
LA vol: 103 mL
LADIAMINDEX: 2.86 cm/m2
LAVOLA4C: 82.2 mL
LEFT ATRIUM END SYS DIAM: 58 mm
LV E/e' medial: 8.98
LV E/e'average: 8.98
LV PW d: 12 mm — AB (ref 0.6–1.1)
LV SIMPSON'S DISK: 54
LV e' LATERAL: 13.7 cm/s
LV sys vol: 53 mL (ref 21–61)
LVDIAVOL: 114 mL (ref 62–150)
LVDIAVOLIN: 56 mL/m2
LVOT MV VTI INDEX: 0.91 cm2/m2
LVOT MV VTI: 1.85
LVOT SV: 37 mL
LVOT VTI: 13.1 cm
LVOT area: 2.84 cm2
LVOT diameter: 19 mm
LVOT peak vel: 88.6 cm/s
LVSYSVOLIN: 26 mL/m2
MV Annulus VTI: 20.1 cm
MV pk E vel: 123 m/s
MVG: 2 mmHg
MVPG: 6 mmHg
MVSPHT: 62 ms
P 1/2 time: 406 ms
RV LATERAL S' VELOCITY: 12.3 cm/s
RV TAPSE: 15.3 mm
RV sys press: 60 mmHg
TDI e' lateral: 13.7
TDI e' medial: 9.45
TRMAXVEL: 334 cm/s
VTI: 181 cm
WEIGHTICAEL: 2780.8 [oz_av]

## 2017-02-26 NOTE — Discharge Summary (Signed)
Physician Discharge Summary  Patient ID: Rick Tyler MRN: [id number] DOB/AGE: [birth year] 76 y.o.  Admit date: 02/23/2017 Discharge date: 02/24/2017  Admission Diagnoses: Bilateral leg edema CKD, IV Essential hypertension Dilated cardiomyopathy CAD COPD Hyperlipidemia Paroxysmal atrial flutter H/O GI bleed  Discharge Diagnoses:  Principal Problem: *Acute on chronic left systolic heart failure ([place])* Active Problems:   CKD (chronic kidney disease), stage IV (HCC)   CAD   COPD   Essential hypertension   Hyperlipidemia   Paroxysmal atrial flutter, CHA2DS2VASc score of 5   H/O GI bleed   Dilated cardiomyopathy  Discharged Condition: fair  Hospital Course: 76 year old male with PMH of dilated cardiomyopathy, hypertension, CAD, COPD paroxysmal atrial flutter, H/O GI bleed and CKD IV had progressive increase in leg edema and shortness of breath with activity. His BNP was markedly elevated. He responded to IV lasix. His hypokalemia was improved with potassium supplementation. He needed additional diuresis but he signed out against medical advice for family obligations. He will see me in 1 week.  Consults: cardiology  Significant Diagnostic Studies: labs: Near normal CBC, CMET with low potassium of 2.8 and BUN of 57 and creatinine of 2.84.  EKG: Atrial fibrillation with non-specific T wave changes.  CXR: Right apical haziness. Mild pulmonary edema.  Treatments: cardiac meds: carvedilol, furosemide, amiodarone and potassium.  Discharge Exam: Blood pressure (!) 152/92, pulse 96, temperature 98.4 F (36.9 C), temperature source Oral, resp. rate 18, height '6\' 1"'$  (1.854 m), weight 78.8 kg (173 lb 12.8 oz), SpO2 95 %. General appearance: alert, cooperative and appears stated age. Head: Normocephalic, atraumatic. Eyes: Brown eyes, pink conjunctiva, corneas clear. PERRL, EOM's intact.  Neck: No adenopathy, no carotid bruit, + JVD, supple, symmetrical, trachea midline and thyroid  not enlarged. Resp: Clearing to auscultation bilaterally. Cardio: Regular rate and rhythm, S1, S2 normal, II/VI systolic murmur, no click, rub or gallop. GI: Soft, non-tender; bowel sounds normal; no organomegaly. Extremities: 2 + edema, cyanosis or clubbing. Skin: Warm and dry.  Neurologic: Alert and oriented X 3, normal strength and tone. Normal coordination and gait.  Disposition: 07-Left Against Medical Advice/Left Without Being Seen/Elopement   Allergies as of 02/24/2017      Reactions   Warfarin Sodium Other (See Comments)   REACTION: Free Bleeder      Medication List    ASK your doctor about these medications   allopurinol 100 MG tablet Commonly known as:  ZYLOPRIM Take 1 tablet (100 mg total) by mouth daily.   amiodarone 200 MG tablet Commonly known as:  PACERONE Take 200 mg by mouth daily.   calcium carbonate 1250 (500 Ca) MG tablet Commonly known as:  OS-CAL - dosed in mg of elemental calcium Take 1 tablet (500 mg of elemental calcium total) by mouth daily.   carvedilol 12.5 MG tablet Commonly known as:  COREG Take 12.5 mg by mouth 2 (two) times daily with a meal.   Colchicine 0.6 MG Caps Take 2 tablets now and another tablet 1 hour later   ferrous sulfate 325 (65 FE) MG tablet Take 1 tablet (325 mg total) by mouth 2 (two) times daily with a meal.   furosemide 80 MG tablet Commonly known as:  LASIX Take 80 mg by mouth 3 (three) times daily.   ibuprofen 600 MG tablet Commonly known as:  ADVIL,MOTRIN Take 1 tablet (600 mg total) by mouth daily as needed for moderate pain.   latanoprost 0.005 % ophthalmic solution Commonly known as:  XALATAN Place 1 drop into  both eyes daily.   nitroGLYCERIN 0.4 MG SL tablet Commonly known as:  NITROSTAT Place 0.4 mg under the tongue every 5 (five) minutes as needed for chest pain.   potassium chloride 10 MEQ tablet Commonly known as:  K-DUR Take 1 tablet (10 mEq total) by mouth daily.         SignedDixie Dials S 02/26/2017, 8:36 AM

## 2017-03-08 ENCOUNTER — Ambulatory Visit: Payer: Medicare Other | Admitting: Podiatry

## 2017-03-31 ENCOUNTER — Emergency Department (HOSPITAL_COMMUNITY)
Admission: EM | Admit: 2017-03-31 | Discharge: 2017-03-31 | Discharge status: Against Medical Advice | Payer: Medicare Other | Attending: Emergency Medicine | Admitting: Emergency Medicine

## 2017-03-31 ENCOUNTER — Encounter (HOSPITAL_COMMUNITY): Payer: Self-pay | Admitting: *Deleted

## 2017-03-31 DIAGNOSIS — R55 Syncope and collapse: Secondary | ICD-10-CM | POA: Diagnosis not present

## 2017-03-31 DIAGNOSIS — Z5329 Procedure and treatment not carried out because of patient's decision for other reasons: Secondary | ICD-10-CM | POA: Insufficient documentation

## 2017-03-31 LAB — I-STAT CHEM 8, ED
BUN: 100 mg/dL — AB (ref 6–20)
CALCIUM ION: 1.16 mmol/L (ref 1.15–1.40)
Chloride: 98 mmol/L — ABNORMAL LOW (ref 101–111)
Creatinine, Ser: 4.8 mg/dL — ABNORMAL HIGH (ref 0.61–1.24)
Glucose, Bld: 95 mg/dL (ref 65–99)
HEMATOCRIT: 40 % (ref 39.0–52.0)
HEMOGLOBIN: 13.6 g/dL (ref 13.0–17.0)
Potassium: 3.7 mmol/L (ref 3.5–5.1)
SODIUM: 136 mmol/L (ref 135–145)
TCO2: 26 mmol/L (ref 0–100)

## 2017-03-31 LAB — I-STAT TROPONIN, ED: TROPONIN I, POC: 0.02 ng/mL (ref 0.00–0.08)

## 2017-03-31 NOTE — ED Notes (Signed)
Pt leaving AMA. Pt given d/c papers but refuses to review d/c papers with this RN.

## 2017-03-31 NOTE — ED Triage Notes (Signed)
To ED after having what was described to pt as a syncopal episode while talking with neighbors. Pt ambulatory into ED. Pt states he did have slight cp this am but that resolved on own. No complaints at present

## 2017-06-05 ENCOUNTER — Other Ambulatory Visit: Payer: Self-pay | Admitting: Cardiovascular Disease

## 2017-06-05 ENCOUNTER — Ambulatory Visit
Admission: RE | Admit: 2017-06-05 | Discharge: 2017-06-05 | Disposition: A | Discharge status: Home / Self Care | Payer: Medicare Other | Source: Ambulatory Visit | Attending: Cardiovascular Disease | Admitting: Cardiovascular Disease

## 2017-06-05 DIAGNOSIS — M5489 Other dorsalgia: Secondary | ICD-10-CM

## 2017-09-03 ENCOUNTER — Encounter: Payer: Self-pay | Admitting: Gastroenterology

## 2017-10-11 ENCOUNTER — Other Ambulatory Visit: Payer: Self-pay | Admitting: Cardiovascular Disease

## 2017-10-11 ENCOUNTER — Ambulatory Visit
Admission: RE | Admit: 2017-10-11 | Discharge: 2017-10-11 | Disposition: A | Discharge status: Home / Self Care | Payer: Medicare Other | Source: Ambulatory Visit | Attending: Cardiovascular Disease | Admitting: Cardiovascular Disease

## 2017-10-11 DIAGNOSIS — R05 Cough: Secondary | ICD-10-CM

## 2017-10-11 DIAGNOSIS — R059 Cough, unspecified: Secondary | ICD-10-CM

## 2017-10-17 ENCOUNTER — Ambulatory Visit
Admission: RE | Admit: 2017-10-17 | Discharge: 2017-10-17 | Disposition: A | Discharge status: Home / Self Care | Payer: Medicare Other | Source: Ambulatory Visit | Attending: Cardiovascular Disease | Admitting: Cardiovascular Disease

## 2017-10-17 ENCOUNTER — Other Ambulatory Visit: Payer: Self-pay | Admitting: Cardiovascular Disease

## 2017-10-17 DIAGNOSIS — R05 Cough: Secondary | ICD-10-CM

## 2017-10-17 DIAGNOSIS — R059 Cough, unspecified: Secondary | ICD-10-CM

## 2017-10-19 ENCOUNTER — Inpatient Hospital Stay (HOSPITAL_COMMUNITY): Admission: AD | Admit: 2017-10-19 | Payer: Medicare Other | Source: Ambulatory Visit | Admitting: Cardiovascular Disease

## 2017-10-23 ENCOUNTER — Encounter: Payer: Self-pay | Admitting: Gastroenterology

## 2017-10-23 ENCOUNTER — Ambulatory Visit (INDEPENDENT_AMBULATORY_CARE_PROVIDER_SITE_OTHER): Payer: Medicare Other | Admitting: Gastroenterology

## 2017-10-23 VITALS — BP 106/70 | HR 78 | Ht 73.0 in | Wt 171.0 lb

## 2017-10-23 DIAGNOSIS — I509 Heart failure, unspecified: Secondary | ICD-10-CM | POA: Diagnosis not present

## 2017-10-23 DIAGNOSIS — R05 Cough: Secondary | ICD-10-CM | POA: Diagnosis not present

## 2017-10-23 DIAGNOSIS — K625 Hemorrhage of anus and rectum: Secondary | ICD-10-CM | POA: Diagnosis not present

## 2017-10-23 DIAGNOSIS — J9601 Acute respiratory failure with hypoxia: Secondary | ICD-10-CM

## 2017-10-23 DIAGNOSIS — R0602 Shortness of breath: Secondary | ICD-10-CM

## 2017-10-23 DIAGNOSIS — R198 Other specified symptoms and signs involving the digestive system and abdomen: Secondary | ICD-10-CM | POA: Diagnosis not present

## 2017-10-23 DIAGNOSIS — R059 Cough, unspecified: Secondary | ICD-10-CM

## 2017-10-23 NOTE — Patient Instructions (Signed)
If you are age 79 or older, your body mass index should be between 23-30. Your Body mass index is 22.56 kg/m. If this is out of the aforementioned range listed, please consider follow up with your Primary Care Provider.  If you are age 79 or younger, your body mass index should be between 19-25. Your Body mass index is 22.56 kg/m. If this is out of the aformentioned range listed, please consider follow up with your Primary Care Provider.   WE STRONGLEY ADVISE YOU TO REPORT TO THE ER IMMEDIATLY!!!!!  Thank you for choosing [facility]  Dr Wilfrid Lund III

## 2017-10-23 NOTE — Progress Notes (Signed)
[facility] Consult Note:  History: Rick Tyler 10/23/2017  Referring physician: Dixie Dials, MD  Reason for consult/chief complaint: Rectal Pain (pt reports having recently seen brb after having a bm; sometimes feels that it is hard to get stool out; denies abdominal pain)   Subjective  HPI:  This is a 77 year old man referred to [name] by his cardiologist noted above (who the patient reports also serves as his primary care provider) for rectal bleeding and diarrhea. The patient is a limited historian, we have a single primary care office note from late October, there is no data accompanying that.  I reviewed a discharge summary from our hospital system last June for some more information on his medical history. He reports that for the last 2 or 3 months he has had more frequent bowel movements that are usually formed, sometimes he feels it is difficult to get him started.  Other days he will have several BMs in the first part of the day as he did earlier today.  He was having frequent rectal bleeding, though typically more so on the paper than in the bowl.  This seems to have subsided within the last few weeks.  He does not recall having any prior colonoscopy, and there is certainly not one found in a review of the epic system.  More worrisome, is that after noticing the patient's dyspnea and then looking through the [facility] epic encounters, I see there was a canceled hospital admission from 4 days ago.  [name] said he was at his doctor's office late last week, they were concerned about his oxygen level and wanted him to be admitted.  [name] apparently declined, stating that he had to take care of his sister's house and collect the rent checks while she is away.   [name] says he is short of breath when he lays down, he has had a dry cough and thinks that if he could just get some antibiotics he would feel better.  ROS:  Review of Systems    Constitutional: Negative for appetite change and unexpected weight change.  HENT: Negative for mouth sores and voice change.   Eyes: Negative for pain and redness.  Respiratory: Positive for cough and shortness of breath.   Cardiovascular: Negative for chest pain and palpitations.  Genitourinary: Negative for dysuria and hematuria.  Musculoskeletal: Negative for arthralgias and myalgias.  Skin: Negative for pallor and rash.  Neurological: Negative for weakness and headaches.  Hematological: Negative for adenopathy.     Past Medical History: Past Medical History:  Diagnosis Date  . A-fib ([place])   . Cancer ([place])   . CHF (congestive heart failure) ([place])   . GI bleed   . Glaucoma   . Hemoptysis   . HTN (hypertension)   . Hyperlipidemia   . Renal insufficiency    Review of the discharge summary from his June 2018 hospital stay indicates he had an exacerbation of CHF with acute on chronic renal failure.  He was diuresed, but unfortunately left AGAINST MEDICAL ADVICE.  Echocardiogram from 5/ 1918 indicates LV function 45-50%, but severe mitral regurgitation, elevated pulmonary pressures and tricuspid regurgitation  Past Surgical History: Past Surgical History:  Procedure Laterality Date  . BRONCHOSCOPY  10/11/2009  . r vats  10/15/2008     Family History: History reviewed. No pertinent family history.  Social History: Social History   Socioeconomic History  . Marital status: Single    Spouse name: None  .  Number of children: None  . Years of education: None  . Highest education level: None  Social Needs  . Financial resource strain: None  . Food insecurity - worry: None  . Food insecurity - inability: None  . Transportation needs - medical: None  . Transportation needs - non-medical: None  Occupational History  . None  Tobacco Use  . Smoking status: Former Research scientist (life sciences)  . Smokeless tobacco: Never Used  . Tobacco comment: Smoked 1 PPD for 40 years; quit several years  ago   Substance and Sexual Activity  . Alcohol use: Yes    Alcohol/week: 3.6 oz    Types: 6 Cans of beer per week    Comment: not on a regular basis  . Drug use: No  . Sexual activity: Not Currently  Other Topics Concern  . None  Social History Narrative  . None    Allergies: Allergies  Allergen Reactions  . Warfarin Sodium Other (See Comments)    REACTION: Free Bleeder    Outpatient Meds: Current Outpatient Medications  Medication Sig Dispense Refill  . allopurinol (ZYLOPRIM) 100 MG tablet Take 1 tablet (100 mg total) by mouth daily. 30 tablet 3  . amiodarone (PACERONE) 200 MG tablet Take 200 mg by mouth daily.     . carvedilol (COREG) 12.5 MG tablet Take 12.5 mg by mouth 2 (two) times daily with a meal.     . furosemide (LASIX) 80 MG tablet Take 80 mg by mouth 3 (three) times daily.     Marland Kitchen latanoprost (XALATAN) 0.005 % ophthalmic solution Place 1 drop into both eyes daily.  12  . nitroGLYCERIN (NITROSTAT) 0.4 MG SL tablet Place 0.4 mg under the tongue every 5 (five) minutes as needed for chest pain.      No current facility-administered medications for this visit.     No longer on iron  ___________________________________________________________________ Objective   Exam:  BP 106/70   Pulse 78   Ht 6\' 1"  (1.854 m)   Wt 171 lb (77.6 kg)   BMI 22.56 kg/m  This is a chronically ill-appearing man who is dyspneic at rest, and more so after a short walk down the hall.  his room air oxygen saturation was 97%, but dropped to 59% with ambulation. With that, he does not become lightheaded or develop chest pain.    Eyes: sclera anicteric, no redness  ENT: oral mucosa moist without lesions, no cervical or supraclavicular lymphadenopathy, good dentition  CV: Irregularly irregular with systolic murmur, F7/T0, no JVD, edema to the knee bilaterally, left greater than right.  Elevated JVP  Resp: clear to auscultation bilaterally, normal RR and effort noted  GI: soft, no  tenderness, with active bowel sounds. No guarding or palpable organomegaly noted.  Skin; warm and dry, no rash or jaundice noted  Neuro: awake, alert and oriented x 3. Normal gross motor function and fluent speech Rectal: Normal external, decreased sphincter tone, prostate enlarged and soft, no palpable internal lesions, brown stool in the rectal vault  Labs:  CBC Latest Ref Rng & Units 03/31/2017 02/23/2017 04/28/2016  WBC 4.0 - 10.5 K/uL - 6.1 4.3  Hemoglobin 13.0 - 17.0 g/dL 13.6 12.3(L) 12.5(L)  Hematocrit 39.0 - 52.0 % 40.0 36.5(L) 36.5(L)  Platelets 150 - 400 K/uL - 179 133(L)   CMP Latest Ref Rng & Units 03/31/2017 02/24/2017 02/23/2017  Glucose 65 - 99 mg/dL 95 117(H) 110(H)  BUN 6 - 20 mg/dL 100(H) 56(H) 57(H)  Creatinine 0.61 - 1.24 mg/dL 4.80(H) 2.68(H)  2.84(H)  Sodium 135 - 145 mmol/L 136 142 141  Potassium 3.5 - 5.1 mmol/L 3.7 3.0(L) 2.8(L)  Chloride 101 - 111 mmol/L 98(L) 111 109  CO2 22 - 32 mmol/L - 21(L) 18(L)  Calcium 8.9 - 10.3 mg/dL - 9.2 9.1  Total Protein 6.5 - 8.1 g/dL - - 6.8  Total Bilirubin 0.3 - 1.2 mg/dL - - QUANTITY NOT SUFFICIENT, UNABLE TO PERFORM TEST  Alkaline Phos 38 - 126 U/L - - 71  AST 15 - 41 U/L - - QUANTITY NOT SUFFICIENT, UNABLE TO PERFORM TEST  ALT 17 - 63 U/L - - QUANTITY NOT SUFFICIENT, UNABLE TO PERFORM TEST    No labs or other recent data for review  Assessment: Encounter Diagnoses  Name Primary?  . Change in bowel function Yes  . Rectal bleeding   . Chronic congestive heart failure, unspecified heart failure type ([place])   . Cough   . Shortness of breath   . Acute respiratory failure with hypoxia (HCC)     This patient has a recent change in bowel habits and rectal bleeding of unclear nature, but the workup for that unquestionably must wait until his more immediate risk is attended to.  He is hypoxic and probably having exacerbation of pulmonary edema.  He needs to be hospitalized today through the emergency room, I offered to call  the ambulance for him.  I told him that his life is in danger and he simply must be hospitalized, and I again offered to call an ambulance.  He refused multiple times, stating that he has to get home and make sure he collects the rent checks.  After that, he figures he will probably call Dr. Doylene Canard this afternoon and ask if there is a bed available at the hospital for admission.  I told him that if he chooses to do so, better plan would be to call 911 and be transported to the emergency department.  I could not change his mind and he left the office. Dr. Merrilee Jansky office was closed for lunch by the time the patient left the clinic, so I will place a call to him this afternoon.  I am not planning further workup at this point until this patient's cardiopulmonary status is attended to if he will allow it.  Total time 45 minutes, over half spent in counseling and attempting to coordinate care.  Thank you for the courtesy of this consult.  Please call me with any questions or concerns.  Nelida Meuse III  CC: Dixie Dials, MD

## 2017-10-24 ENCOUNTER — Emergency Department (HOSPITAL_COMMUNITY): Payer: Medicare Other

## 2017-10-24 ENCOUNTER — Emergency Department (HOSPITAL_COMMUNITY)
Admission: EM | Admit: 2017-10-24 | Discharge: 2017-10-24 | Discharge status: Against Medical Advice | Payer: Medicare Other | Attending: Emergency Medicine | Admitting: Emergency Medicine

## 2017-10-24 ENCOUNTER — Encounter (HOSPITAL_COMMUNITY): Payer: Self-pay

## 2017-10-24 ENCOUNTER — Other Ambulatory Visit: Payer: Self-pay

## 2017-10-24 DIAGNOSIS — R05 Cough: Secondary | ICD-10-CM | POA: Diagnosis present

## 2017-10-24 DIAGNOSIS — Z5321 Procedure and treatment not carried out due to patient leaving prior to being seen by health care provider: Secondary | ICD-10-CM | POA: Insufficient documentation

## 2017-10-24 LAB — BASIC METABOLIC PANEL
Anion gap: 15 (ref 5–15)
BUN: 70 mg/dL — AB (ref 6–20)
CO2: 21 mmol/L — AB (ref 22–32)
CREATININE: 3.93 mg/dL — AB (ref 0.61–1.24)
Calcium: 8.9 mg/dL (ref 8.9–10.3)
Chloride: 102 mmol/L (ref 101–111)
GFR calc Af Amer: 15 mL/min — ABNORMAL LOW (ref >60)
GFR calc non Af Amer: 13 mL/min — ABNORMAL LOW (ref >60)
Glucose, Bld: 75 mg/dL (ref 65–99)
Potassium: 3.4 mmol/L — ABNORMAL LOW (ref 3.5–5.1)
Sodium: 138 mmol/L (ref 135–145)

## 2017-10-24 LAB — CBC
HEMATOCRIT: 36.4 % — AB (ref 39.0–52.0)
Hemoglobin: 11.8 g/dL — ABNORMAL LOW (ref 13.0–17.0)
MCH: 28.2 pg (ref 26.0–34.0)
MCHC: 32.4 g/dL (ref 30.0–36.0)
MCV: 87.1 fL (ref 78.0–100.0)
PLATELETS: 194 10*3/uL (ref 150–400)
RBC: 4.18 MIL/uL — ABNORMAL LOW (ref 4.22–5.81)
RDW: 14.2 % (ref 11.5–15.5)
WBC: 4 10*3/uL (ref 4.0–10.5)

## 2017-10-24 LAB — I-STAT TROPONIN, ED: Troponin i, poc: 0.01 ng/mL (ref 0.00–0.08)

## 2017-10-24 LAB — BRAIN NATRIURETIC PEPTIDE: B Natriuretic Peptide: 1223.7 pg/mL — ABNORMAL HIGH (ref 0.0–100.0)

## 2017-10-24 NOTE — ED Notes (Addendum)
Spoke with MD Cydney Ok about patient's care and labs. Stated that patient came to the office and he evaluated the patient. Pt was okay to follow-up with him in four days.

## 2017-10-24 NOTE — ED Triage Notes (Signed)
Patient here with cough that is worse at night x 3 weeks. States that it makes him feel like he cant breath. Alert and oriented, speaking complete sentences, denies pain

## 2017-10-24 NOTE — ED Notes (Signed)
Pt wants to leave. Pt encouraged to stay and see a provider. Pt and son insist on leaving. Will follow up with primary tomorrow. Labels turned in.

## 2017-10-24 NOTE — ED Notes (Signed)
Called pt's number x 3. Left voice message that stated, "This is [name] from the [facility] Emergency Department. It is pertinent that you call me back."

## 2018-02-27 ENCOUNTER — Emergency Department (HOSPITAL_COMMUNITY)
Admission: EM | Admit: 2018-02-27 | Discharge: 2018-02-27 | Discharge status: Against Medical Advice | Payer: Medicare Other | Attending: Emergency Medicine | Admitting: Emergency Medicine

## 2018-02-27 ENCOUNTER — Other Ambulatory Visit: Payer: Self-pay

## 2018-02-27 ENCOUNTER — Encounter (HOSPITAL_COMMUNITY): Payer: Self-pay

## 2018-02-27 ENCOUNTER — Emergency Department (HOSPITAL_COMMUNITY): Payer: Medicare Other

## 2018-02-27 DIAGNOSIS — R0602 Shortness of breath: Secondary | ICD-10-CM | POA: Insufficient documentation

## 2018-02-27 DIAGNOSIS — Z5321 Procedure and treatment not carried out due to patient leaving prior to being seen by health care provider: Secondary | ICD-10-CM | POA: Diagnosis not present

## 2018-02-27 LAB — CBC
HEMATOCRIT: 41 % (ref 39.0–52.0)
Hemoglobin: 13.1 g/dL (ref 13.0–17.0)
MCH: 28.7 pg (ref 26.0–34.0)
MCHC: 32 g/dL (ref 30.0–36.0)
MCV: 89.7 fL (ref 78.0–100.0)
Platelets: 164 10*3/uL (ref 150–400)
RBC: 4.57 MIL/uL (ref 4.22–5.81)
RDW: 15.3 % (ref 11.5–15.5)
WBC: 4.5 10*3/uL (ref 4.0–10.5)

## 2018-02-27 LAB — BASIC METABOLIC PANEL
Anion gap: 13 (ref 5–15)
BUN: 43 mg/dL — AB (ref 6–20)
CHLORIDE: 106 mmol/L (ref 101–111)
CO2: 23 mmol/L (ref 22–32)
Calcium: 9.6 mg/dL (ref 8.9–10.3)
Creatinine, Ser: 2.8 mg/dL — ABNORMAL HIGH (ref 0.61–1.24)
GFR calc Af Amer: 23 mL/min — ABNORMAL LOW (ref >60)
GFR calc non Af Amer: 20 mL/min — ABNORMAL LOW (ref >60)
Glucose, Bld: 97 mg/dL (ref 65–99)
POTASSIUM: 3.6 mmol/L (ref 3.5–5.1)
SODIUM: 142 mmol/L (ref 135–145)

## 2018-02-27 LAB — I-STAT TROPONIN, ED: Troponin i, poc: 0 ng/mL (ref 0.00–0.08)

## 2018-02-27 NOTE — ED Notes (Signed)
Nurse first assisting another patient while patient told registration at nurse first. Patient did not wait to speak with medical staff before leaving.

## 2018-02-27 NOTE — ED Triage Notes (Signed)
Pt endorses shob for several months with cough with white mucous. Speaking in complete sentences. Has hx of chf. Hypertensive.

## 2018-03-04 ENCOUNTER — Encounter (HOSPITAL_COMMUNITY): Payer: Self-pay

## 2018-03-04 ENCOUNTER — Other Ambulatory Visit: Payer: Self-pay

## 2018-03-04 ENCOUNTER — Inpatient Hospital Stay (HOSPITAL_COMMUNITY)
Admission: AD | Admit: 2018-03-04 | Discharge: 2018-03-08 | DRG: 291 | Disposition: A | Discharge status: Home / Self Care | Payer: Medicare Other | Source: Ambulatory Visit | Attending: Cardiovascular Disease | Admitting: Cardiovascular Disease

## 2018-03-04 DIAGNOSIS — I5023 Acute on chronic systolic (congestive) heart failure: Secondary | ICD-10-CM | POA: Diagnosis present

## 2018-03-04 DIAGNOSIS — N184 Chronic kidney disease, stage 4 (severe): Secondary | ICD-10-CM | POA: Diagnosis present

## 2018-03-04 DIAGNOSIS — Z8719 Personal history of other diseases of the digestive system: Secondary | ICD-10-CM

## 2018-03-04 DIAGNOSIS — Z888 Allergy status to other drugs, medicaments and biological substances status: Secondary | ICD-10-CM

## 2018-03-04 DIAGNOSIS — I420 Dilated cardiomyopathy: Secondary | ICD-10-CM | POA: Diagnosis present

## 2018-03-04 DIAGNOSIS — I2510 Atherosclerotic heart disease of native coronary artery without angina pectoris: Secondary | ICD-10-CM | POA: Diagnosis present

## 2018-03-04 DIAGNOSIS — H409 Unspecified glaucoma: Secondary | ICD-10-CM | POA: Diagnosis present

## 2018-03-04 DIAGNOSIS — Z79899 Other long term (current) drug therapy: Secondary | ICD-10-CM

## 2018-03-04 DIAGNOSIS — I5021 Acute systolic (congestive) heart failure: Secondary | ICD-10-CM | POA: Diagnosis present

## 2018-03-04 DIAGNOSIS — E785 Hyperlipidemia, unspecified: Secondary | ICD-10-CM | POA: Diagnosis present

## 2018-03-04 DIAGNOSIS — I10 Essential (primary) hypertension: Secondary | ICD-10-CM | POA: Diagnosis present

## 2018-03-04 DIAGNOSIS — M797 Fibromyalgia: Secondary | ICD-10-CM | POA: Diagnosis present

## 2018-03-04 DIAGNOSIS — I509 Heart failure, unspecified: Secondary | ICD-10-CM

## 2018-03-04 DIAGNOSIS — I482 Chronic atrial fibrillation: Secondary | ICD-10-CM | POA: Diagnosis present

## 2018-03-04 DIAGNOSIS — Z87891 Personal history of nicotine dependence: Secondary | ICD-10-CM

## 2018-03-04 DIAGNOSIS — E876 Hypokalemia: Secondary | ICD-10-CM | POA: Diagnosis present

## 2018-03-04 DIAGNOSIS — J449 Chronic obstructive pulmonary disease, unspecified: Secondary | ICD-10-CM | POA: Diagnosis present

## 2018-03-04 DIAGNOSIS — I130 Hypertensive heart and chronic kidney disease with heart failure and stage 1 through stage 4 chronic kidney disease, or unspecified chronic kidney disease: Principal | ICD-10-CM | POA: Diagnosis present

## 2018-03-04 DIAGNOSIS — I4892 Unspecified atrial flutter: Secondary | ICD-10-CM | POA: Diagnosis present

## 2018-03-04 HISTORY — DX: Dyspnea, unspecified: R06.00

## 2018-03-04 MED ORDER — NITROGLYCERIN 0.4 MG SL SUBL
0.4000 mg | SUBLINGUAL_TABLET | SUBLINGUAL | Status: DC | PRN
  Administered 2018-03-06: 0.4 mg via SUBLINGUAL
  Filled 2018-03-04: qty 1

## 2018-03-04 MED ORDER — ACETAMINOPHEN 325 MG PO TABS: 650.0000 mg | ORAL_TABLET | ORAL | Status: DC | PRN

## 2018-03-04 MED ORDER — ONDANSETRON HCL 4 MG/2ML IJ SOLN: 4.0000 mg | Freq: Four times a day (QID) | INTRAMUSCULAR | Status: DC | PRN

## 2018-03-04 MED ORDER — SODIUM CHLORIDE 0.9% FLUSH
3.0000 mL | Freq: Two times a day (BID) | INTRAVENOUS | Status: DC
  Administered 2018-03-04 – 2018-03-08 (×6): 3 mL via INTRAVENOUS

## 2018-03-04 MED ORDER — SODIUM CHLORIDE 0.9 % IV SOLN: 250.0000 mL | INTRAVENOUS | Status: DC | PRN

## 2018-03-04 MED ORDER — FUROSEMIDE 10 MG/ML IJ SOLN
40.0000 mg | Freq: Two times a day (BID) | INTRAMUSCULAR | Status: DC
  Administered 2018-03-04: 40 mg via INTRAVENOUS
  Filled 2018-03-04: qty 4

## 2018-03-04 MED ORDER — ALLOPURINOL 100 MG PO TABS: 100.0000 mg | ORAL_TABLET | Freq: Every day | ORAL | Status: DC

## 2018-03-04 MED ORDER — SODIUM CHLORIDE 0.9% FLUSH: 3.0000 mL | INTRAVENOUS | Status: DC | PRN

## 2018-03-04 MED ORDER — LATANOPROST 0.005 % OP SOLN
1.0000 [drp] | Freq: Every day | OPHTHALMIC | Status: DC
  Administered 2018-03-04 – 2018-03-06 (×3): 1 [drp] via OPHTHALMIC
  Filled 2018-03-04: qty 2.5

## 2018-03-04 MED ORDER — AMIODARONE HCL 200 MG PO TABS
200.0000 mg | ORAL_TABLET | Freq: Every day | ORAL | Status: DC
  Administered 2018-03-05 – 2018-03-08 (×4): 200 mg via ORAL
  Filled 2018-03-04 (×4): qty 1

## 2018-03-04 MED ORDER — CARVEDILOL 12.5 MG PO TABS: 12.5000 mg | ORAL_TABLET | Freq: Two times a day (BID) | ORAL | Status: DC

## 2018-03-04 MED ORDER — HEPARIN SODIUM (PORCINE) 5000 UNIT/ML IJ SOLN
5000.0000 [IU] | Freq: Three times a day (TID) | INTRAMUSCULAR | Status: DC
  Filled 2018-03-04 (×4): qty 1

## 2018-03-04 NOTE — H&P (Signed)
Referring Physician:   [name] is an 77 y.o. male.                       Chief Complaint: Shortness of breath, cough and leg edema.  HPI: 77 year old male with PMH of dilated cardiomyopathy, hypertension, hyperlipidemia, atrial flutter, GI bleed and CKD, IV has increasing shortness of breath, cough and leg swelling x 2 weeks. He was partially evaluated in ER 5 days back when he decided to leave without being evaluated by ED doctor. Chest X-ray did not show pneumonia or pulmonary edema and WBC count was normal along with stable BUN/Creatinine.   Past Medical History:  Diagnosis Date  . A-fib ([place])   . Cancer ([place])   . CHF (congestive heart failure) ([place])   . GI bleed   . Glaucoma   . Hemoptysis   . HTN (hypertension)   . Hyperlipidemia   . Renal insufficiency       Past Surgical History:  Procedure Laterality Date  . BRONCHOSCOPY  10/11/2009  . r vats  10/15/2008    No family history on file. Social History:  reports that he has quit smoking. He has never used smokeless tobacco. He reports that he drinks about 3.6 oz of alcohol per week. He reports that he does not use drugs.  Allergies:  Allergies  Allergen Reactions  . Warfarin Sodium Other (See Comments)    REACTION: Free Bleeder    Medications Prior to Admission  Medication Sig Dispense Refill  . allopurinol (ZYLOPRIM) 100 MG tablet Take 1 tablet (100 mg total) by mouth daily. 30 tablet 3  . amiodarone (PACERONE) 200 MG tablet Take 200 mg by mouth daily.     . carvedilol (COREG) 12.5 MG tablet Take 12.5 mg by mouth 2 (two) times daily with a meal.     . furosemide (LASIX) 80 MG tablet Take 80 mg by mouth 3 (three) times daily.     Marland Kitchen latanoprost (XALATAN) 0.005 % ophthalmic solution Place 1 drop into both eyes daily.  12  . nitroGLYCERIN (NITROSTAT) 0.4 MG SL tablet Place 0.4 mg under the tongue every 5 (five) minutes as needed for chest pain.       No results found for this or any previous visit (from the  past 48 hour(s)). No results found.  Review Of Systems Constitutional: No fever, chills, weight loss or gain. Eyes: No vision change, wears glasses. No discharge or pain. Ears: No hearing loss, No tinnitus. Respiratory: No asthma, COPD, pneumonias. Positive shortness of breath. No hemoptysis. Cardiovascular: Positive chest pain, palpitation, leg edema. Gastrointestinal: No nausea, vomiting, diarrhea, constipation. H/O GI bleed. No hepatitis. Genitourinary: No dysuria, hematuria, kidney stone. No incontinance. Neurological: No headache, stroke, seizures.  Psychiatry: No psych facility admission for anxiety, depression, suicide. No detox. Skin: No rash. Musculoskeletal: Positive joint pain, fibromyalgia. No neck pain, back pain. Lymphadenopathy: No lymphadenopathy. Hematology: No anemia or easy bruising.   There were no vitals taken for this visit. There is no height or weight on file to calculate BMI. General appearance: alert, cooperative, appears stated age and no distress Head: Normocephalic, atraumatic. Eyes: Brown eyes, pink conjunctiva, corneas clear. PERRL, EOM's intact. Neck: No adenopathy, no carotid bruit, full JVD, supple, symmetrical, trachea midline and thyroid not enlarged. Resp: Basal crackles to auscultation bilaterally. Cardio: Regular rate and rhythm, S1, S2 normal, II/VI systolic murmur, no click, rub or gallop GI: Soft, non-tender; bowel sounds normal; no organomegaly. Extremities: 4 +  edema of lower legs, no cyanosis or clubbing. Skin: Warm and dry.  Neurologic: Alert and oriented X 3, normal strength. Normal coordination and slow gait.  Assessment/Plan Acute left heart systolic failure Dilated cardiomyopathy, non-ischemic CAD COPD CKD, IV Hypertension Hyperlipidemia Paroxysmal atrial flutter H/O GI bleed  IV lasix. Troponin, BNP and TSH. Home medications.  [name], MD  03/04/2018, 7:42 PM

## 2018-03-04 NOTE — Progress Notes (Signed)
Patient arrived to [place]. Dr Doylene Canard paged and made aware. Will continue to monitor patient.

## 2018-03-05 ENCOUNTER — Encounter (HOSPITAL_COMMUNITY): Payer: Self-pay | Admitting: General Practice

## 2018-03-05 ENCOUNTER — Observation Stay (HOSPITAL_COMMUNITY): Payer: Medicare Other

## 2018-03-05 DIAGNOSIS — I2510 Atherosclerotic heart disease of native coronary artery without angina pectoris: Secondary | ICD-10-CM | POA: Diagnosis present

## 2018-03-05 DIAGNOSIS — M797 Fibromyalgia: Secondary | ICD-10-CM | POA: Diagnosis present

## 2018-03-05 DIAGNOSIS — I4892 Unspecified atrial flutter: Secondary | ICD-10-CM | POA: Diagnosis present

## 2018-03-05 DIAGNOSIS — Z888 Allergy status to other drugs, medicaments and biological substances status: Secondary | ICD-10-CM | POA: Diagnosis not present

## 2018-03-05 DIAGNOSIS — E876 Hypokalemia: Secondary | ICD-10-CM | POA: Diagnosis present

## 2018-03-05 DIAGNOSIS — J449 Chronic obstructive pulmonary disease, unspecified: Secondary | ICD-10-CM | POA: Diagnosis present

## 2018-03-05 DIAGNOSIS — Z8719 Personal history of other diseases of the digestive system: Secondary | ICD-10-CM | POA: Diagnosis not present

## 2018-03-05 DIAGNOSIS — N184 Chronic kidney disease, stage 4 (severe): Secondary | ICD-10-CM | POA: Diagnosis present

## 2018-03-05 DIAGNOSIS — I5023 Acute on chronic systolic (congestive) heart failure: Secondary | ICD-10-CM | POA: Diagnosis present

## 2018-03-05 DIAGNOSIS — I420 Dilated cardiomyopathy: Secondary | ICD-10-CM | POA: Diagnosis present

## 2018-03-05 DIAGNOSIS — I130 Hypertensive heart and chronic kidney disease with heart failure and stage 1 through stage 4 chronic kidney disease, or unspecified chronic kidney disease: Secondary | ICD-10-CM | POA: Diagnosis present

## 2018-03-05 DIAGNOSIS — H409 Unspecified glaucoma: Secondary | ICD-10-CM | POA: Diagnosis present

## 2018-03-05 DIAGNOSIS — Z79899 Other long term (current) drug therapy: Secondary | ICD-10-CM | POA: Diagnosis not present

## 2018-03-05 DIAGNOSIS — I482 Chronic atrial fibrillation: Secondary | ICD-10-CM | POA: Diagnosis present

## 2018-03-05 DIAGNOSIS — Z87891 Personal history of nicotine dependence: Secondary | ICD-10-CM | POA: Diagnosis not present

## 2018-03-05 DIAGNOSIS — E785 Hyperlipidemia, unspecified: Secondary | ICD-10-CM | POA: Diagnosis present

## 2018-03-05 LAB — CBC WITH DIFFERENTIAL/PLATELET
ABS IMMATURE GRANULOCYTES: 0 10*3/uL (ref 0.0–0.1)
Basophils Absolute: 0.1 10*3/uL (ref 0.0–0.1)
Basophils Relative: 1 %
Eosinophils Absolute: 0.2 10*3/uL (ref 0.0–0.7)
Eosinophils Relative: 4 %
HCT: 39.4 % (ref 39.0–52.0)
HEMOGLOBIN: 12.6 g/dL — AB (ref 13.0–17.0)
IMMATURE GRANULOCYTES: 0 %
LYMPHS PCT: 24 %
Lymphs Abs: 1.1 10*3/uL (ref 0.7–4.0)
MCH: 28.4 pg (ref 26.0–34.0)
MCHC: 32 g/dL (ref 30.0–36.0)
MCV: 88.7 fL (ref 78.0–100.0)
Monocytes Absolute: 0.7 10*3/uL (ref 0.1–1.0)
Monocytes Relative: 15 %
NEUTROS ABS: 2.7 10*3/uL (ref 1.7–7.7)
NEUTROS PCT: 56 %
Platelets: 159 10*3/uL (ref 150–400)
RBC: 4.44 MIL/uL (ref 4.22–5.81)
RDW: 15.2 % (ref 11.5–15.5)
WBC: 4.8 10*3/uL (ref 4.0–10.5)

## 2018-03-05 LAB — BASIC METABOLIC PANEL
Anion gap: 13 (ref 5–15)
BUN: 47 mg/dL — AB (ref 6–20)
CHLORIDE: 108 mmol/L (ref 101–111)
CO2: 21 mmol/L — ABNORMAL LOW (ref 22–32)
Calcium: 9.7 mg/dL (ref 8.9–10.3)
Creatinine, Ser: 3 mg/dL — ABNORMAL HIGH (ref 0.61–1.24)
GFR calc Af Amer: 21 mL/min — ABNORMAL LOW (ref >60)
GFR calc non Af Amer: 18 mL/min — ABNORMAL LOW (ref >60)
Glucose, Bld: 82 mg/dL (ref 65–99)
POTASSIUM: 3.6 mmol/L (ref 3.5–5.1)
Sodium: 142 mmol/L (ref 135–145)

## 2018-03-05 LAB — COMPREHENSIVE METABOLIC PANEL
ALBUMIN: 3.5 g/dL (ref 3.5–5.0)
ALK PHOS: 70 U/L (ref 38–126)
ALT: 16 U/L — ABNORMAL LOW (ref 17–63)
AST: 22 U/L (ref 15–41)
Anion gap: 14 (ref 5–15)
BILIRUBIN TOTAL: 0.8 mg/dL (ref 0.3–1.2)
BUN: 48 mg/dL — AB (ref 6–20)
CO2: 25 mmol/L (ref 22–32)
Calcium: 9.7 mg/dL (ref 8.9–10.3)
Chloride: 105 mmol/L (ref 101–111)
Creatinine, Ser: 3.1 mg/dL — ABNORMAL HIGH (ref 0.61–1.24)
GFR calc Af Amer: 20 mL/min — ABNORMAL LOW (ref >60)
GFR calc non Af Amer: 18 mL/min — ABNORMAL LOW (ref >60)
GLUCOSE: 93 mg/dL (ref 65–99)
POTASSIUM: 2.9 mmol/L — AB (ref 3.5–5.1)
Sodium: 144 mmol/L (ref 135–145)
Total Protein: 7 g/dL (ref 6.5–8.1)

## 2018-03-05 LAB — TROPONIN I
Troponin I: 0.03 ng/mL (ref <0.03)
Troponin I: <0.03 ng/mL (ref <0.03)

## 2018-03-05 LAB — TSH: TSH: 0.654 u[IU]/mL (ref 0.350–4.500)

## 2018-03-05 LAB — BRAIN NATRIURETIC PEPTIDE: B Natriuretic Peptide: 2260.5 pg/mL — ABNORMAL HIGH (ref 0.0–100.0)

## 2018-03-05 MED ORDER — AMLODIPINE BESYLATE 5 MG PO TABS
5.0000 mg | ORAL_TABLET | Freq: Every day | ORAL | Status: DC
  Administered 2018-03-05 – 2018-03-08 (×4): 5 mg via ORAL
  Filled 2018-03-05 (×4): qty 1

## 2018-03-05 MED ORDER — BENZONATATE 100 MG PO CAPS
100.0000 mg | ORAL_CAPSULE | Freq: Three times a day (TID) | ORAL | Status: DC
  Administered 2018-03-05 – 2018-03-08 (×10): 100 mg via ORAL
  Filled 2018-03-05 (×10): qty 1

## 2018-03-05 MED ORDER — FUROSEMIDE 10 MG/ML IJ SOLN
80.0000 mg | Freq: Two times a day (BID) | INTRAMUSCULAR | Status: DC
  Administered 2018-03-05 – 2018-03-08 (×7): 80 mg via INTRAVENOUS
  Filled 2018-03-05 (×7): qty 8

## 2018-03-05 MED ORDER — DOPAMINE-DEXTROSE 3.2-5 MG/ML-% IV SOLN: 2.5000 ug/kg/min | INTRAVENOUS | Status: DC

## 2018-03-05 MED ORDER — HYDRALAZINE HCL 25 MG PO TABS
25.0000 mg | ORAL_TABLET | Freq: Four times a day (QID) | ORAL | Status: DC
  Administered 2018-03-05 (×4): 25 mg via ORAL
  Filled 2018-03-05 (×3): qty 1

## 2018-03-05 MED ORDER — CARVEDILOL 12.5 MG PO TABS
12.5000 mg | ORAL_TABLET | Freq: Two times a day (BID) | ORAL | Status: DC
  Administered 2018-03-05 – 2018-03-08 (×7): 12.5 mg via ORAL
  Filled 2018-03-05 (×7): qty 1

## 2018-03-05 MED ORDER — POTASSIUM CHLORIDE CRYS ER 20 MEQ PO TBCR
40.0000 meq | EXTENDED_RELEASE_TABLET | ORAL | Status: AC
  Administered 2018-03-05 (×2): 40 meq via ORAL
  Filled 2018-03-05 (×2): qty 2

## 2018-03-05 MED ORDER — DOBUTAMINE IN D5W 4-5 MG/ML-% IV SOLN
5.0000 ug/kg/min | INTRAVENOUS | Status: DC
  Administered 2018-03-05 – 2018-03-07 (×2): 5 ug/kg/min via INTRAVENOUS
  Filled 2018-03-05 (×3): qty 250

## 2018-03-05 NOTE — Care Management Note (Addendum)
Case Management Note  Patient Details  Name: LABRIAN TORREGROSSA MRN: [id number] Date of Birth: 1941-03-18  Subjective/Objective:    Pt admitted on 03/04/18 with acute on chronic left heart systolic heart failure.  PTA, pt independent, lives at home with sister.  Pt states PCP is Dr. Doylene Canard; he says he has no problems getting Rx meds filled.                    Action/Plan: Pt starting on Dobutamine drip.  Will place PT/OT orders to determine home needs.  Will follow progress.  Expected Discharge Date:  03/07/18               Expected Discharge Plan:  [place]  In-House Referral:     Discharge planning Services  CM Consult  Post Acute Care Choice:    Choice offered to:     DME Arranged:    DME Agency:     HH Arranged:    [place] Agency:     Status of Service:  In process, will continue to follow  If discussed at Long Length of Stay Meetings, dates discussed:    Additional Comments:  Reinaldo Raddle, RN, BSN  Trauma/Neuro ICU Case Manager [phone]

## 2018-03-05 NOTE — Plan of Care (Signed)
  Problem: Activity: Goal: Risk for activity intolerance will decrease Outcome: Progressing   Problem: Safety: Goal: Ability to remain free from injury will improve Outcome: Progressing   

## 2018-03-05 NOTE — Progress Notes (Signed)
Ref: Dixie Dials, MD   Subjective:  Shortness of breath and cough continues. Monitor atrial fibrillation. Markedly elevated BNP.  Objective:  Vital Signs in the last 24 hours: Temp:  [97.5 F (36.4 C)-97.9 F (36.6 C)] 97.9 F (36.6 C) (05/28 0554) Pulse Rate:  [86-91] 87 (05/28 0554) Cardiac Rhythm: Atrial fibrillation (05/27 4403) Resp:  [18-19] 18 (05/28 0554) BP: (143-177)/(108-117) 177/117 (05/28 0554) SpO2:  [90 %-95 %] 90 % (05/28 0554) Weight:  [76.7 kg (169 lb 3.2 oz)-76.8 kg (169 lb 5 oz)] 76.7 kg (169 lb 3.2 oz) (05/28 0554)  Physical Exam: BP Readings from Last 1 Encounters:  03/05/18 (!) 177/117     Wt Readings from Last 1 Encounters:  03/05/18 76.7 kg (169 lb 3.2 oz)    Weight change:  Body mass index is 22.32 kg/m. HEENT: [place]/AT, Eyes-Brown, PERL, EOMI, Conjunctiva-Pink, Sclera-Non-icteric.  Neck: Full JVD, No bruit, Trachea midline. Lungs:  Clearing, Bilateral. Cardiac:  Regular rhythm, normal S1 and S2, no S3. II/VI systolic murmur. Abdomen:  Soft, non-tender. BS present. Extremities:  4 + edema present. No cyanosis. No clubbing. CNS: AxOx3, Cranial nerves grossly intact, moves all 4 extremities.  Skin: Warm and dry.   Intake/Output from previous day: 05/27 0701 - 05/28 0700 In: 440 [P.O.:340] Out: 650 [Urine:650]    Lab Results: BMET    Component Value Date/Time   NA 144 03/05/2018 0222   NA 142 02/27/2018 1115   NA 138 10/24/2017 1434   K 2.9 (L) 03/05/2018 0222   K 3.6 02/27/2018 1115   K 3.4 (L) 10/24/2017 1434   CL 105 03/05/2018 0222   CL 106 02/27/2018 1115   CL 102 10/24/2017 1434   CO2 25 03/05/2018 0222   CO2 23 02/27/2018 1115   CO2 21 (L) 10/24/2017 1434   GLUCOSE 93 03/05/2018 0222   GLUCOSE 97 02/27/2018 1115   GLUCOSE 75 10/24/2017 1434   BUN 48 (H) 03/05/2018 0222   BUN 43 (H) 02/27/2018 1115   BUN 70 (H) 10/24/2017 1434   CREATININE 3.10 (H) 03/05/2018 0222   CREATININE 2.80 (H) 02/27/2018 1115   CREATININE 3.93  (H) 10/24/2017 1434   CALCIUM 9.7 03/05/2018 0222   CALCIUM 9.6 02/27/2018 1115   CALCIUM 8.9 10/24/2017 1434   CALCIUM 9.8 03/10/2011 1657   GFRNONAA 18 (L) 03/05/2018 0222   GFRNONAA 20 (L) 02/27/2018 1115   GFRNONAA 13 (L) 10/24/2017 1434   GFRAA 20 (L) 03/05/2018 0222   GFRAA 23 (L) 02/27/2018 1115   GFRAA 15 (L) 10/24/2017 1434   CBC    Component Value Date/Time   WBC 4.8 03/05/2018 0222   RBC 4.44 03/05/2018 0222   HGB 12.6 (L) 03/05/2018 0222   HGB 11.9 (L) 05/25/2010 1036   HCT 39.4 03/05/2018 0222   HCT 34.6 (L) 05/25/2010 1036   PLT 159 03/05/2018 0222   PLT 160 05/25/2010 1036   MCV 88.7 03/05/2018 0222   MCV 89.5 05/25/2010 1036   MCH 28.4 03/05/2018 0222   MCHC 32.0 03/05/2018 0222   RDW 15.2 03/05/2018 0222   RDW 14.5 05/25/2010 1036   LYMPHSABS 1.1 03/05/2018 0222   LYMPHSABS 1.0 05/25/2010 1036   MONOABS 0.7 03/05/2018 0222   MONOABS 0.4 05/25/2010 1036   EOSABS 0.2 03/05/2018 0222   EOSABS 0.1 05/25/2010 1036   BASOSABS 0.1 03/05/2018 0222   BASOSABS 0.0 05/25/2010 1036   HEPATIC Function Panel Recent Labs    03/05/18 0222  PROT 7.0   HEMOGLOBIN A1C No  components found for: HGA1C,  MPG CARDIAC ENZYMES Lab Results  Component Value Date   CKTOTAL 67 08/22/2010   CKMB 2.3 08/22/2010   TROPONINI 0.03 (HH) 03/05/2018   TROPONINI <0.03 02/24/2017   TROPONINI <0.03 02/23/2017   BNP No results for input(s): PROBNP in the last 8760 hours. TSH Recent Labs    03/05/18 0222  TSH 0.654   CHOLESTEROL No results for input(s): CHOL in the last 8760 hours.  Scheduled Meds: . allopurinol  100 mg Oral Daily  . amiodarone  200 mg Oral Daily  . amLODipine  5 mg Oral Daily  . benzonatate  100 mg Oral TID  . carvedilol  12.5 mg Oral BID WC  . furosemide  80 mg Intravenous BID  . heparin  5,000 Units Subcutaneous Q8H  . latanoprost  1 drop Both Eyes QHS  . sodium chloride flush  3 mL Intravenous Q12H   Continuous Infusions: . sodium chloride     . DOPamine     PRN Meds:.sodium chloride, acetaminophen, nitroGLYCERIN, ondansetron (ZOFRAN) IV, sodium chloride flush  Assessment/Plan: Acute on chronic left heart systolic failure Dilated cardiomyopathy CAD COPD CKD, IV Hypertension Hyperlipidemia Chronic atrial fibrillation, CHA2DS2VASc score of 5 H/O GI bleed Hypokalemia  Patient refusing DVT prophylaxis. Start low dose inotrope as tolerated Increase Lasix dose. Add potassium supplement. Add Amlodipine and hydralazine for blood pressure control. Admit to inpatient.   LOS: 0 days    Dixie Dials  MD  03/05/2018, 8:57 AM

## 2018-03-06 LAB — BASIC METABOLIC PANEL
Anion gap: 11 (ref 5–15)
BUN: 43 mg/dL — AB (ref 6–20)
CALCIUM: 9.7 mg/dL (ref 8.9–10.3)
CO2: 26 mmol/L (ref 22–32)
CREATININE: 2.78 mg/dL — AB (ref 0.61–1.24)
Chloride: 106 mmol/L (ref 101–111)
GFR calc Af Amer: 23 mL/min — ABNORMAL LOW (ref >60)
GFR, EST NON AFRICAN AMERICAN: 20 mL/min — AB (ref >60)
Glucose, Bld: 90 mg/dL (ref 65–99)
Potassium: 3.5 mmol/L (ref 3.5–5.1)
Sodium: 143 mmol/L (ref 135–145)

## 2018-03-06 MED ORDER — HYDRALAZINE HCL 50 MG PO TABS
50.0000 mg | ORAL_TABLET | Freq: Three times a day (TID) | ORAL | Status: DC
  Administered 2018-03-06 – 2018-03-08 (×6): 50 mg via ORAL
  Filled 2018-03-06 (×7): qty 1

## 2018-03-06 MED ORDER — POTASSIUM CHLORIDE ER 10 MEQ PO TBCR
10.0000 meq | EXTENDED_RELEASE_TABLET | Freq: Two times a day (BID) | ORAL | Status: DC
  Administered 2018-03-06 – 2018-03-08 (×5): 10 meq via ORAL
  Filled 2018-03-06 (×9): qty 1

## 2018-03-06 MED ORDER — ALBUTEROL SULFATE (2.5 MG/3ML) 0.083% IN NEBU
2.5000 mg | INHALATION_SOLUTION | Freq: Four times a day (QID) | RESPIRATORY_TRACT | Status: DC
  Administered 2018-03-06 (×3): 2.5 mg via RESPIRATORY_TRACT
  Filled 2018-03-06 (×3): qty 3

## 2018-03-06 NOTE — Evaluation (Signed)
Occupational Therapy Evaluation Patient Details Name: Rick Tyler MRN: [id number] DOB: [birth year] Today's Date: 03/06/2018    History of Present Illness 77 year old male with PMH of dilated cardiomyopathy, hypertension, hyperlipidemia, atrial flutter, GI bleed and CKD. Presenting to ED with shortness of breath, cough and leg swelling x 2 weeks.   Clinical Impression   PTA, pt was living with his sister and was independent and working as a Firefighter. Pt currently performing ADLs and functional mobility at supervision level presenting near baseline function. Pt denies any SOB or fatigue during grooming at sink and room level functional mobility. Answering pt's questions. Recommend dc home once medically stable per physician. All acute OT needs met and will sign off.     Follow Up Recommendations  No OT follow up;Supervision/Assistance - 24 hour    Equipment Recommendations  None recommended by OT    Recommendations for Other Services PT consult     Precautions / Restrictions Precautions Precautions: None Restrictions Weight Bearing Restrictions: No      Mobility Bed Mobility Overal bed mobility: Needs Assistance Bed Mobility: Supine to Sit     Supine to sit: Supervision     General bed mobility comments: increased time and supervision for safety. No physical A needed  Transfers Overall transfer level: Needs assistance Equipment used: None Transfers: Sit to/from Stand Sit to Stand: Supervision         General transfer comment: supervision for safety    Balance                                           ADL either performed or assessed with clinical judgement   ADL Overall ADL's : Needs assistance/impaired                                       General ADL Comments: Pt performing ADLs and functional mobility at supervision level. Pt performing oral care at sink, LB ADLs, and then room level functional mobility. Pt not  presenting with SOB or fatigue and when asked pt denies any SOB or fatigue. Pt presents near baseline function     Vision         Perception     Praxis      Pertinent Vitals/Pain Pain Assessment: No/denies pain     Hand Dominance Right   Extremity/Trunk Assessment Upper Extremity Assessment Upper Extremity Assessment: Overall WFL for tasks assessed   Lower Extremity Assessment Lower Extremity Assessment: Generalized weakness(BLE edema)   Cervical / Trunk Assessment Cervical / Trunk Assessment: Normal   Communication Communication Communication: No difficulties   Cognition Arousal/Alertness: Awake/alert Behavior During Therapy: WFL for tasks assessed/performed Overall Cognitive Status: Within Functional Limits for tasks assessed                                     General Comments  Pt reporting several compaints including MD putting wrong medications in chart, RN not bringing his morning medications at one time, and food services bringing his three bowls of peaches and not peeling his hard boiled eggs. Listening to pt and attempting to understanding of frustrations. RN notified.     Exercises     Shoulder Instructions  Home Living Family/patient expects to be discharged to:: Private residence Living Arrangements: Other relatives(lives with sister) Available Help at Discharge: Available 24 hours/day;Family Type of Home: House       Home Layout: Two level     Bathroom Shower/Tub: Teacher, early years/pre: Standard Bathroom Accessibility: Yes              Prior Functioning/Environment Level of Independence: Independent(works as Firefighter)        Comments: ADLs, IADLs, and is a Firefighter        OT Problem List: Decreased activity tolerance;Decreased knowledge of use of DME or AE;Increased edema      OT Treatment/Interventions:      OT Goals(Current goals can be found in the care plan section) Acute Rehab  OT Goals Patient Stated Goal: Go home OT Goal Formulation: All assessment and education complete, DC therapy  OT Frequency:     Barriers to D/C:            Co-evaluation              AM-PAC PT "6 Clicks" Daily Activity     Outcome Measure Help from another person eating meals?: None Help from another person taking care of personal grooming?: None Help from another person toileting, which includes using toliet, bedpan, or urinal?: None Help from another person bathing (including washing, rinsing, drying)?: None Help from another person to put on and taking off regular upper body clothing?: None Help from another person to put on and taking off regular lower body clothing?: None 6 Click Score: 24   End of Session Nurse Communication: Mobility status  Activity Tolerance: Patient tolerated treatment well Patient left: in chair;with call bell/phone within reach  OT Visit Diagnosis: Unsteadiness on feet (R26.81);Other abnormalities of gait and mobility (R26.89);Muscle weakness (generalized) (M62.81)                Time: 0600-4599 OT Time Calculation (min): 25 min Charges:  OT General Charges $OT Visit: 1 Visit OT Evaluation $OT Eval Low Complexity: 1 Low OT Treatments $Self Care/Home Management : 8-22 mins G-Codes:     Alaylah Heatherington MSOT, OTR/L Acute Rehab Pager: [phone] Office: [place] 03/06/2018, 9:03 AM

## 2018-03-06 NOTE — Evaluation (Signed)
Physical Therapy Evaluation Patient Details Name: Rick Tyler MRN: [id number] DOB: 06-25-1941 Today's Date: 03/06/2018   History of Present Illness  77 year old male with PMH of dilated cardiomyopathy, hypertension, hyperlipidemia, atrial flutter, GI bleed and CKD. Presenting to ED with shortness of breath, cough and leg swelling x 2 weeks.    Clinical Impression  Pt admitted with above diagnosis. Pt currently with functional limitations due to the deficits listed below (see PT Problem List). 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 Upn eval pt presents with decreased activity tolerance from baseline,  with mild dyspnea with limited ambulation, SpO2=91% on RA with activity. Ambulated 120' without rest break or assistive device, supervision leve with mild unsteadiness notedl.  Pt will benefit from skilled PT to increase their independence and safety with mobility to allow discharge to the venue listed below.       Follow Up Recommendations No PT follow up    Equipment Recommendations       Recommendations for Other Services       Precautions / Restrictions Precautions Precautions: None Restrictions Weight Bearing Restrictions: No      Mobility  Bed Mobility Overal bed mobility: Needs Assistance Bed Mobility: Supine to Sit     Supine to sit: Supervision     General bed mobility comments: In chair at entry  Transfers Overall transfer level: Needs assistance Equipment used: None Transfers: Sit to/from Stand Sit to Stand: Supervision         General transfer comment: supervision for safety  Ambulation/Gait Ambulation/Gait assistance: Supervision Ambulation Distance (Feet): 120 Feet Assistive device: None Gait Pattern/deviations: WFL(Within Functional Limits) Gait velocity: decreased   General Gait Details: Patient ambulating with mild unsteadiness, mild dyspnea, SpO2 91% on RA  Stairs             Wheelchair Mobility    Modified Rankin (Stroke Patients Only)       Balance                                             Pertinent Vitals/Pain Pain Assessment: No/denies pain    Home Living Family/patient expects to be discharged to:: Private residence Living Arrangements: Other relatives Available Help at Discharge: Available 24 hours/day;Family Type of Home: House Home Access: Stairs to enter   Technical brewer of Steps: 7 Home Layout: Two level        Prior Function Level of Independence: Independent         Comments: ADLs, IADLs, and is a Medical laboratory scientific officer Dominance   Dominant Hand: Right    Extremity/Trunk Assessment   Upper Extremity Assessment Upper Extremity Assessment: Overall WFL for tasks assessed    Lower Extremity Assessment Lower Extremity Assessment: (BLE strength 4/5)    Cervical / Trunk Assessment Cervical / Trunk Assessment: Normal  Communication   Communication: No difficulties  Cognition Arousal/Alertness: Awake/alert Behavior During Therapy: WFL for tasks assessed/performed Overall Cognitive Status: Within Functional Limits for tasks assessed                                        General Comments General comments (skin integrity, edema, etc.): Pt reporting several compaints including MD putting wrong medications in chart, RN  not bringing his morning medications at one time, and food services bringing his three bowls of peaches and not peeling his hard boiled eggs. Listening to pt and attempting to understanding of frustrations. RN notified.     Exercises     Assessment/Plan    PT Assessment Patient needs continued PT services  PT Problem List Decreased strength;Decreased activity tolerance;Cardiopulmonary status limiting activity       PT Treatment Interventions Stair training;Gait training;Functional mobility training;Therapeutic activities    PT Goals (Current goals can  be found in the Care Plan section)  Acute Rehab PT Goals Patient Stated Goal: Go home, play golf again PT Goal Formulation: With patient Time For Goal Achievement: 03/13/18 Potential to Achieve Goals: Good    Frequency Min 3X/week   Barriers to discharge        Co-evaluation               AM-PAC PT "6 Clicks" Daily Activity  Outcome Measure Difficulty turning over in bed (including adjusting bedclothes, sheets and blankets)?: None Difficulty moving from lying on back to sitting on the side of the bed? : None Difficulty sitting down on and standing up from a chair with arms (e.g., wheelchair, bedside commode, etc,.)?: None Help needed moving to and from a bed to chair (including a wheelchair)?: None Help needed walking in hospital room?: A Little Help needed climbing 3-5 steps with a railing? : A Little 6 Click Score: 22    End of Session Equipment Utilized During Treatment: Gait belt Activity Tolerance: Patient tolerated treatment well Patient left: in chair;with call bell/phone within reach;with nursing/sitter in room(RT) Nurse Communication: Mobility status PT Visit Diagnosis: Unsteadiness on feet (R26.81)    Time: 0940-1010 PT Time Calculation (min) (ACUTE ONLY): 30 min   Charges:   PT Evaluation $PT Eval Low Complexity: 1 Low PT Treatments $Gait Training: 8-22 mins   PT G Codes:      Reinaldo Berber, PT, DPT Acute Rehab Services Pager: [phone]    Reinaldo Berber 03/06/2018, 10:32 AM

## 2018-03-06 NOTE — Progress Notes (Signed)
Ref: Rick Dials, MD   Subjective:  Shortness of breath continues. Some diuresis and 4 pound weight loss. Improving blood pressure control.  Objective:  Vital Signs in the last 24 hours: Temp:  [98 F (36.7 C)-98.2 F (36.8 C)] 98.2 F (36.8 C) (05/29 0425) Pulse Rate:  [85-96] 85 (05/29 1533) Cardiac Rhythm: Atrial fibrillation (05/29 0700) Resp:  [18-20] 18 (05/29 1533) BP: (149-177)/(102-120) 149/102 (05/29 1533) SpO2:  [86 %-96 %] 92 % (05/29 1657) Weight:  [75.7 kg (166 lb 14.4 oz)] 75.7 kg (166 lb 14.4 oz) (05/29 0425)  Physical Exam: BP Readings from Last 1 Encounters:  03/06/18 (!) 149/102     Wt Readings from Last 1 Encounters:  03/06/18 75.7 kg (166 lb 14.4 oz)    Weight change: -1.095 kg (-2 lb 6.6 oz) Body mass index is 22.02 kg/m. HEENT: [place]/AT, Eyes-Brown, PERL, EOMI, Conjunctiva-Pink, Sclera-Non-icteric Neck: 1 + JVD, No bruit, Trachea midline. Lungs:  Clearing, Bilateral. Cardiac:  Regular rhythm, normal S1 and S2, no S3. II/VI systolic murmur. Abdomen:  Soft, non-tender. BS present. Extremities:  2 + edema present. No cyanosis. No clubbing. CNS: AxOx3, Cranial nerves grossly intact, moves all 4 extremities.  Skin: Warm and dry.   Intake/Output from previous day: 05/28 0701 - 05/29 0700 In: 690.9 [P.O.:600; I.V.:90.9] Out: 200 [Urine:200]    Lab Results: BMET    Component Value Date/Time   NA 143 03/06/2018 0718   NA 142 03/05/2018 1530   NA 144 03/05/2018 0222   K 3.5 03/06/2018 0718   K 3.6 03/05/2018 1530   K 2.9 (L) 03/05/2018 0222   CL 106 03/06/2018 0718   CL 108 03/05/2018 1530   CL 105 03/05/2018 0222   CO2 26 03/06/2018 0718   CO2 21 (L) 03/05/2018 1530   CO2 25 03/05/2018 0222   GLUCOSE 90 03/06/2018 0718   GLUCOSE 82 03/05/2018 1530   GLUCOSE 93 03/05/2018 0222   BUN 43 (H) 03/06/2018 0718   BUN 47 (H) 03/05/2018 1530   BUN 48 (H) 03/05/2018 0222   CREATININE 2.78 (H) 03/06/2018 0718   CREATININE 3.00 (H) 03/05/2018 1530    CREATININE 3.10 (H) 03/05/2018 0222   CALCIUM 9.7 03/06/2018 0718   CALCIUM 9.7 03/05/2018 1530   CALCIUM 9.7 03/05/2018 0222   CALCIUM 9.8 03/10/2011 1657   GFRNONAA 20 (L) 03/06/2018 0718   GFRNONAA 18 (L) 03/05/2018 1530   GFRNONAA 18 (L) 03/05/2018 0222   GFRAA 23 (L) 03/06/2018 0718   GFRAA 21 (L) 03/05/2018 1530   GFRAA 20 (L) 03/05/2018 0222   CBC    Component Value Date/Time   WBC 4.8 03/05/2018 0222   RBC 4.44 03/05/2018 0222   HGB 12.6 (L) 03/05/2018 0222   HGB 11.9 (L) 05/25/2010 1036   HCT 39.4 03/05/2018 0222   HCT 34.6 (L) 05/25/2010 1036   PLT 159 03/05/2018 0222   PLT 160 05/25/2010 1036   MCV 88.7 03/05/2018 0222   MCV 89.5 05/25/2010 1036   MCH 28.4 03/05/2018 0222   MCHC 32.0 03/05/2018 0222   RDW 15.2 03/05/2018 0222   RDW 14.5 05/25/2010 1036   LYMPHSABS 1.1 03/05/2018 0222   LYMPHSABS 1.0 05/25/2010 1036   MONOABS 0.7 03/05/2018 0222   MONOABS 0.4 05/25/2010 1036   EOSABS 0.2 03/05/2018 0222   EOSABS 0.1 05/25/2010 1036   BASOSABS 0.1 03/05/2018 0222   BASOSABS 0.0 05/25/2010 1036   HEPATIC Function Panel Recent Labs    03/05/18 0222  PROT 7.0  HEMOGLOBIN A1C No components found for: HGA1C,  MPG CARDIAC ENZYMES Lab Results  Component Value Date   CKTOTAL 67 08/22/2010   CKMB 2.3 08/22/2010   TROPONINI <0.03 03/05/2018   TROPONINI <0.03 03/05/2018   TROPONINI 0.03 (HH) 03/05/2018   BNP No results for input(s): PROBNP in the last 8760 hours. TSH Recent Labs    03/05/18 0222  TSH 0.654   CHOLESTEROL No results for input(s): CHOL in the last 8760 hours.  Scheduled Meds: . albuterol  2.5 mg Nebulization QID  . amiodarone  200 mg Oral Daily  . amLODipine  5 mg Oral Daily  . benzonatate  100 mg Oral TID  . carvedilol  12.5 mg Oral BID WC  . furosemide  80 mg Intravenous BID  . heparin  5,000 Units Subcutaneous Q8H  . hydrALAZINE  50 mg Oral Q8H  . latanoprost  1 drop Both Eyes QHS  . potassium chloride  10 mEq Oral BID  .  sodium chloride flush  3 mL Intravenous Q12H   Continuous Infusions: . sodium chloride    . DOBUTamine 5 mcg/kg/min (03/05/18 1800)   PRN Meds:.sodium chloride, acetaminophen, nitroGLYCERIN, ondansetron (ZOFRAN) IV, sodium chloride flush  Assessment/Plan: Acute on chronic left heart systolic failure Dilated cardiomyopathy Chronic atrial fibrillation CAD COPD CKD, IV Hypertension Hyperlipidemia H/O GI bleed Hypokalemia-resolved  Add albuterol treatment   LOS: 1 day    Rick Dials  MD  03/06/2018, 6:10 PM

## 2018-03-06 NOTE — Progress Notes (Signed)
Unable to obtain accurate output during the night. Pt. States he can not use urinal.

## 2018-03-07 LAB — BASIC METABOLIC PANEL
ANION GAP: 9 (ref 5–15)
BUN: 39 mg/dL — ABNORMAL HIGH (ref 6–20)
CALCIUM: 9.5 mg/dL (ref 8.9–10.3)
CHLORIDE: 110 mmol/L (ref 101–111)
CO2: 24 mmol/L (ref 22–32)
Creatinine, Ser: 2.63 mg/dL — ABNORMAL HIGH (ref 0.61–1.24)
GFR calc non Af Amer: 22 mL/min — ABNORMAL LOW (ref >60)
GFR, EST AFRICAN AMERICAN: 25 mL/min — AB (ref >60)
GLUCOSE: 89 mg/dL (ref 65–99)
Potassium: 3.7 mmol/L (ref 3.5–5.1)
Sodium: 143 mmol/L (ref 135–145)

## 2018-03-07 MED ORDER — DOBUTAMINE IN D5W 4-5 MG/ML-% IV SOLN
5.0000 ug/kg/min | INTRAVENOUS | Status: DC
  Administered 2018-03-07: 5 ug/kg/min via INTRAVENOUS

## 2018-03-07 MED ORDER — ALBUTEROL SULFATE (2.5 MG/3ML) 0.083% IN NEBU
2.5000 mg | INHALATION_SOLUTION | Freq: Three times a day (TID) | RESPIRATORY_TRACT | Status: DC
  Administered 2018-03-07 – 2018-03-08 (×4): 2.5 mg via RESPIRATORY_TRACT
  Filled 2018-03-07 (×5): qty 3

## 2018-03-07 NOTE — Progress Notes (Signed)
Called Dr. Doylene Canard-  Pt BP was 112/94- wanted to know if ok to give Hydralazine.   Also complained of epigastric pressure radiating down to groin area.   Dr. Doylene Canard is going to place order for CT of abdomen reduce amount of hydralazine

## 2018-03-07 NOTE — Progress Notes (Signed)
Physical Therapy Treatment Patient Details Name: Rick Tyler MRN: [id number] DOB: 01-05-1941 Today's Date: 03/07/2018    History of Present Illness 77 year old male with PMH of dilated cardiomyopathy, hypertension, hyperlipidemia, atrial flutter, GI bleed and CKD. Presenting to ED with shortness of breath, cough and leg swelling x 2 weeks.    PT Comments    Pt up in chair upon arrival, motivated to get up and AMB the unit. SpO2 100% upon arrival on room air, drops to 93% after 129ft AMB in hall on room air. Pt stops q 170ft to rest momentarily, offers no report of distress, but noted HR jump to 120s momentarily. Pt reports subjective improvement in activity tolerance, and noted improvement in AMB distance this date, as well as improved stability to O2 sats. Pt progressing well toward goals.   Follow Up Recommendations  No PT follow up     Equipment Recommendations       Recommendations for Other Services       Precautions / Restrictions Precautions Precautions: None    Mobility  Bed Mobility               General bed mobility comments: In chair at entry  Transfers Overall transfer level: Needs assistance Equipment used: None Transfers: Sit to/from Stand           General transfer comment: supervision for safety; appears somewhat weak   Ambulation/Gait Ambulation/Gait assistance: Supervision Ambulation Distance (Feet): 200 Feet(212ft then 168ft ) Assistive device: (holding onto IV pole) Gait Pattern/deviations: (slow, but mostly steady; stops to shaddow box in hallway for 3 seconds) Gait velocity: 0.48    General Gait Details: intermittent breaks q 169ft, brief increase in HR to 120s. Pt reports feelign stronger today than yesterday. Takes a 3 minute seated break prior to second AMB trial.    Stairs Stairs: (pt defers)           Wheelchair Mobility    Modified Rankin (Stroke Patients Only)       Balance Overall balance assessment: Modified  Independent;Mild deficits observed, not formally tested                                          Cognition Arousal/Alertness: Awake/alert Behavior During Therapy: WFL for tasks assessed/performed Overall Cognitive Status: Within Functional Limits for tasks assessed                                        Exercises      General Comments        Pertinent Vitals/Pain Pain Assessment: No/denies pain    Home Living                      Prior Function            PT Goals (current goals can now be found in the care plan section) Acute Rehab PT Goals Patient Stated Goal: Go home, play golf again PT Goal Formulation: With patient Time For Goal Achievement: 03/13/18 Potential to Achieve Goals: Good Progress towards PT goals: Progressing toward goals    Frequency    Min 3X/week      PT Plan      Co-evaluation              AM-PAC  PT "6 Clicks" Daily Activity  Outcome Measure  Difficulty turning over in bed (including adjusting bedclothes, sheets and blankets)?: None Difficulty moving from lying on back to sitting on the side of the bed? : None Difficulty sitting down on and standing up from a chair with arms (e.g., wheelchair, bedside commode, etc,.)?: A Little Help needed moving to and from a bed to chair (including a wheelchair)?: A Little Help needed walking in hospital room?: A Little Help needed climbing 3-5 steps with a railing? : A Little 6 Click Score: 20    End of Session Equipment Utilized During Treatment: Gait belt Activity Tolerance: Patient tolerated treatment well;Patient limited by fatigue Patient left: in bed;with call bell/phone within reach Nurse Communication: Mobility status PT Visit Diagnosis: Unsteadiness on feet (R26.81)     Time: 1355-1420 PT Time Calculation (min) (ACUTE ONLY): 25 min  Charges:  $Therapeutic Activity: 23-37 mins                    G Codes:       2:31 PM,  03/12/2018 Etta Grandchild, PT, DPT Physical Therapist - [place] [phone] (Pager)  [phone] (Office)      Rick Tyler C 12-Mar-2018, 2:29 PM

## 2018-03-07 NOTE — Progress Notes (Signed)
Pt. Complaining of pain at IV insertion site. IV team paged to insert new PIV. Continuous infusion stopped. IV team on the floor.

## 2018-03-07 NOTE — Plan of Care (Signed)
  Problem: Activity: Goal: Risk for activity intolerance will decrease Outcome: Progressing   Problem: Pain Managment: Goal: General experience of comfort will improve Outcome: Progressing   Problem: Safety: Goal: Ability to remain free from injury will improve Outcome: Progressing   

## 2018-03-07 NOTE — Progress Notes (Signed)
Pharmacist Heart Failure Core Measure Documentation  Assessment: Rick Tyler has an EF documented as 40-45% on echo  Rationale: Heart failure patients with left ventricular systolic dysfunction (LVSD) and an EF < 40% should be prescribed an angiotensin converting enzyme inhibitor (ACEI) or angiotensin receptor blocker (ARB) at discharge unless a contraindication is documented in the medical record.  This patient is not currently on an ACEI or ARB for HF.  This note is being placed in the record in order to provide documentation that a contraindication to the use of these agents is present for this encounter.  ACE Inhibitor or Angiotensin Receptor Blocker is contraindicated (specify all that apply)  []   ACEI allergy AND ARB allergy []   Angioedema []   Moderate or severe aortic stenosis []   Hyperkalemia []   Hypotension []   Renal artery stenosis [x]   Worsening renal function, preexisting renal disease or dysfunction   Georgina Peer 03/07/2018 11:18 AM

## 2018-03-08 ENCOUNTER — Encounter (HOSPITAL_COMMUNITY): Payer: Self-pay | Admitting: *Deleted

## 2018-03-08 ENCOUNTER — Inpatient Hospital Stay (HOSPITAL_COMMUNITY): Payer: Medicare Other

## 2018-03-08 LAB — BASIC METABOLIC PANEL
ANION GAP: 11 (ref 5–15)
BUN: 36 mg/dL — ABNORMAL HIGH (ref 6–20)
CHLORIDE: 107 mmol/L (ref 101–111)
CO2: 23 mmol/L (ref 22–32)
Calcium: 9.4 mg/dL (ref 8.9–10.3)
Creatinine, Ser: 2.62 mg/dL — ABNORMAL HIGH (ref 0.61–1.24)
GFR calc non Af Amer: 22 mL/min — ABNORMAL LOW (ref >60)
GFR, EST AFRICAN AMERICAN: 25 mL/min — AB (ref >60)
Glucose, Bld: 83 mg/dL (ref 65–99)
POTASSIUM: 3.6 mmol/L (ref 3.5–5.1)
Sodium: 141 mmol/L (ref 135–145)

## 2018-03-08 MED ORDER — ALBUTEROL SULFATE (2.5 MG/3ML) 0.083% IN NEBU: 2.5000 mg | INHALATION_SOLUTION | Freq: Four times a day (QID) | RESPIRATORY_TRACT | 12 refills | Status: AC | PRN

## 2018-03-08 MED ORDER — ALBUTEROL SULFATE (2.5 MG/3ML) 0.083% IN NEBU: 2.5000 mg | INHALATION_SOLUTION | Freq: Four times a day (QID) | RESPIRATORY_TRACT | Status: DC | PRN

## 2018-03-08 MED ORDER — AMLODIPINE BESYLATE 5 MG PO TABS: 5.0000 mg | ORAL_TABLET | Freq: Every day | ORAL | 3 refills | Status: DC

## 2018-03-08 MED ORDER — HYDRALAZINE HCL 25 MG PO TABS: 25.0000 mg | ORAL_TABLET | Freq: Three times a day (TID) | ORAL | 3 refills | Status: AC

## 2018-03-08 MED ORDER — POTASSIUM CHLORIDE ER 10 MEQ PO TBCR: 10.0000 meq | EXTENDED_RELEASE_TABLET | Freq: Every day | ORAL | 1 refills | Status: DC

## 2018-03-08 MED ORDER — BENZONATATE 100 MG PO CAPS: 100.0000 mg | ORAL_CAPSULE | Freq: Three times a day (TID) | ORAL | 0 refills | Status: DC

## 2018-03-08 NOTE — Discharge Summary (Signed)
Physician Discharge Summary  Patient ID: Rick Tyler MRN: [id number] DOB/AGE: January 26, 1941 77 y.o.  Admit date: 03/04/2018 Discharge date: 03/08/2018  Admission Diagnoses: Acute left third systolic failure Dilated cardiomyopathy, nonischemic CAD COPD CKD, stage IV Hypertension Hyperlipidemia Paroxysmal atrial flutter History of GI bleed  Discharge Diagnoses:  Principal Problem:   Acute on chronic left systolic heart failure (HCC) Active Problems:   CKD (chronic kidney disease), stage IV (HCC)   Essential hypertension   CAD   COPD   CKD, stage IV   Hyperlipidemia   Chronic atrial fibrillation, CHA2DS2VASC score of 5   History of GI bleed   Abdominal pain  Discharged Condition: fair  Hospital Course: 77 year old male with a past medical history of dilated cardiomyopathy, hypertension, hyperlipidemia, chronic atrial fibrillation, GI bleed and CKD stage IV had increasing shortness of breath cough and leg swelling x 2 weeks. He responded well to combination of IV Lasix and IV dobutamine drip. He underwent CT of the abdomen and Pelvis for generalized abdominal discomfort and swelling. CT of the abdomen was unremarkable. He also required albuterol nebulizer treatment for COPD.  He was discharged home in stable condition with follow-up by me in 1 week.   Consults: cardiology  Significant Diagnostic Studies: labs: CBC was unremarkable.  See make showed potassium level of 2.9 BUN of 48 and creatinine of 3.10 otherwise unremarkable.  BNP was markedly elevated at 2260.5 pg.  TSH was normal troponin I was near normal.  Treatments: cardiac meds: amlodipine, amiodarone, carvedilol, hydralazine, SL NTG, furosemide and potassium.  Discharge Exam: Blood pressure (!) 156/85, pulse 84, temperature 98 F (36.7 C), temperature source Oral, resp. rate 20, height 6\' 1"  (1.854 m), weight 74.1 kg (163 lb 6.4 oz), SpO2 96 %. General appearance: alert, cooperative and appears stated age. Mild  respiratory distress. Head: Normocephalic, atraumatic. Eyes: Brown eyes, pink conjunctiva, corneas clear. PERRL, EOM's intact.  Neck: No adenopathy, no carotid bruit, no JVD, supple, symmetrical, trachea midline and thyroid not enlarged. Resp: Clearing to auscultation bilaterally. Cardio: Regular rate and rhythm, S1, S2 normal, II/VI systolic murmur, no click, rub or gallop. GI: Soft, non-tender; bowel sounds normal; no organomegaly. Extremities: 1 + edema, no cyanosis or clubbing. Skin: Warm and dry.  Neurologic: Alert and oriented X 3, normal strength and tone. Normal coordination and slow gait.  Disposition: Discharge disposition: 01-Home or Self Care        Allergies as of 03/08/2018      Reactions   Warfarin Sodium Other (See Comments)   REACTION: Free Bleeder      Medication List    TAKE these medications   acetaminophen-codeine 300-30 MG tablet Commonly known as:  TYLENOL #3 Take 1 tablet by mouth daily as needed for pain.   albuterol (2.5 MG/3ML) 0.083% nebulizer solution Commonly known as:  PROVENTIL Take 3 mLs (2.5 mg total) by nebulization every 6 (six) hours as needed for wheezing or shortness of breath.   amiodarone 200 MG tablet Commonly known as:  PACERONE Take 200 mg by mouth daily.   amLODipine 5 MG tablet Commonly known as:  NORVASC Take 1 tablet (5 mg total) by mouth daily. Start taking on:  03/09/2018   benzonatate 100 MG capsule Commonly known as:  TESSALON Take 1 capsule (100 mg total) by mouth 3 (three) times daily.   carvedilol 12.5 MG tablet Commonly known as:  COREG Take 12.5 mg by mouth 2 (two) times daily with a meal.   furosemide 80 MG tablet Commonly known  as:  LASIX Take 80 mg by mouth 3 (three) times daily.   hydrALAZINE 25 MG tablet Commonly known as:  APRESOLINE Take 1 tablet (25 mg total) by mouth 3 (three) times daily.   latanoprost 0.005 % ophthalmic solution Commonly known as:  XALATAN Place 1 drop into both eyes  daily.   LORazepam 0.5 MG tablet Commonly known as:  ATIVAN Take 0.5 mg by mouth daily as needed for anxiety or seizure.   nitroGLYCERIN 0.4 MG SL tablet Commonly known as:  NITROSTAT Place 0.4 mg under the tongue every 5 (five) minutes as needed for chest pain.   potassium chloride 10 MEQ tablet Commonly known as:  K-DUR Take 1 tablet (10 mEq total) by mouth daily.      Follow-up Information    Dixie Dials, MD. Go on 03/11/2018.   Specialty:  Cardiology Why:  @11 :30am Contact information: [place] [address] [phone]           Signed: [name] 03/08/2018, 2:15 PM

## 2018-03-08 NOTE — Progress Notes (Signed)
Ref: Dixie Dials, MD   Subjective:  C/O distended abdomen with generalized discomfort. Afebrile. Decreasing leg edema.  Objective:  Vital Signs in the last 24 hours: Temp:  [98 F (36.7 C)-98.9 F (37.2 C)] 98 F (36.7 C) (05/31 1140) Pulse Rate:  [84-120] 84 (05/31 1140) Cardiac Rhythm: Atrial fibrillation (05/31 0851) Resp:  [20-22] 20 (05/31 1140) BP: (139-160)/(85-113) 156/85 (05/31 1140) SpO2:  [93 %-100 %] 96 % (05/31 1140) Weight:  [74.1 kg (163 lb 6.4 oz)] 74.1 kg (163 lb 6.4 oz) (05/31 0504)  Physical Exam: BP Readings from Last 1 Encounters:  03/08/18 (!) 156/85     Wt Readings from Last 1 Encounters:  03/08/18 74.1 kg (163 lb 6.4 oz)    Weight change: -0.68 kg (-1 lb 8 oz) Body mass index is 21.56 kg/m. HEENT: [place]/AT, Eyes-Brown, PERL, EOMI, Conjunctiva-Pink, Sclera-Non-icteric Neck: + JVD, No bruit, Trachea midline. Lungs:  Clear, Bilateral. Cardiac:  Irregular rhythm, normal S1 and S2, no S3. II/VI systolic murmur. Abdomen:  Soft, non-tender. BS present. Extremities:  1 + edema present. No cyanosis. No clubbing. CNS: AxOx3, Cranial nerves grossly intact, moves all 4 extremities.  Skin: Warm and dry.   Intake/Output from previous day: 05/30 0701 - 05/31 0700 In: 235.8 [P.O.:100; I.V.:135.8] Out: 253 [Urine:250; Stool:3]    Lab Results: BMET    Component Value Date/Time   NA 141 03/08/2018 0439   NA 143 03/07/2018 0439   NA 143 03/06/2018 0718   K 3.6 03/08/2018 0439   K 3.7 03/07/2018 0439   K 3.5 03/06/2018 0718   CL 107 03/08/2018 0439   CL 110 03/07/2018 0439   CL 106 03/06/2018 0718   CO2 23 03/08/2018 0439   CO2 24 03/07/2018 0439   CO2 26 03/06/2018 0718   GLUCOSE 83 03/08/2018 0439   GLUCOSE 89 03/07/2018 0439   GLUCOSE 90 03/06/2018 0718   BUN 36 (H) 03/08/2018 0439   BUN 39 (H) 03/07/2018 0439   BUN 43 (H) 03/06/2018 0718   CREATININE 2.62 (H) 03/08/2018 0439   CREATININE 2.63 (H) 03/07/2018 0439   CREATININE 2.78 (H)  03/06/2018 0718   CALCIUM 9.4 03/08/2018 0439   CALCIUM 9.5 03/07/2018 0439   CALCIUM 9.7 03/06/2018 0718   CALCIUM 9.8 03/10/2011 1657   GFRNONAA 22 (L) 03/08/2018 0439   GFRNONAA 22 (L) 03/07/2018 0439   GFRNONAA 20 (L) 03/06/2018 0718   GFRAA 25 (L) 03/08/2018 0439   GFRAA 25 (L) 03/07/2018 0439   GFRAA 23 (L) 03/06/2018 0718   CBC    Component Value Date/Time   WBC 4.8 03/05/2018 0222   RBC 4.44 03/05/2018 0222   HGB 12.6 (L) 03/05/2018 0222   HGB 11.9 (L) 05/25/2010 1036   HCT 39.4 03/05/2018 0222   HCT 34.6 (L) 05/25/2010 1036   PLT 159 03/05/2018 0222   PLT 160 05/25/2010 1036   MCV 88.7 03/05/2018 0222   MCV 89.5 05/25/2010 1036   MCH 28.4 03/05/2018 0222   MCHC 32.0 03/05/2018 0222   RDW 15.2 03/05/2018 0222   RDW 14.5 05/25/2010 1036   LYMPHSABS 1.1 03/05/2018 0222   LYMPHSABS 1.0 05/25/2010 1036   MONOABS 0.7 03/05/2018 0222   MONOABS 0.4 05/25/2010 1036   EOSABS 0.2 03/05/2018 0222   EOSABS 0.1 05/25/2010 1036   BASOSABS 0.1 03/05/2018 0222   BASOSABS 0.0 05/25/2010 1036   HEPATIC Function Panel Recent Labs    03/05/18 0222  PROT 7.0   HEMOGLOBIN A1C No components found for: HGA1C,  MPG CARDIAC ENZYMES Lab Results  Component Value Date   CKTOTAL 67 08/22/2010   CKMB 2.3 08/22/2010   TROPONINI <0.03 03/05/2018   TROPONINI <0.03 03/05/2018   TROPONINI 0.03 (HH) 03/05/2018   BNP No results for input(s): PROBNP in the last 8760 hours. TSH Recent Labs    03/05/18 0222  TSH 0.654   CHOLESTEROL No results for input(s): CHOL in the last 8760 hours.  Scheduled Meds: . amiodarone  200 mg Oral Daily  . amLODipine  5 mg Oral Daily  . benzonatate  100 mg Oral TID  . carvedilol  12.5 mg Oral BID WC  . furosemide  80 mg Intravenous BID  . hydrALAZINE  50 mg Oral Q8H  . latanoprost  1 drop Both Eyes QHS  . potassium chloride  10 mEq Oral BID  . sodium chloride flush  3 mL Intravenous Q12H   Continuous Infusions: . sodium chloride     PRN  Meds:.sodium chloride, acetaminophen, albuterol, nitroGLYCERIN, ondansetron (ZOFRAN) IV, sodium chloride flush  Assessment/Plan: Acute on chronic left heart systolic failure Dilated cardiomyopathy Chronic atrial fibrillation CAD COPD CKD stage IV Hypertension Hyperlipidemia History of GI bleed Hypokalemia, resolved  Consider role CT of the abdomen.   LOS: 2 days    Dixie Dials  MD  03/08/2018, 2:10 PM

## 2018-03-08 NOTE — Care Management Important Message (Signed)
Important Message  Patient Details  Name: Rick Tyler MRN: [id number] Date of Birth: May 10, 1941   Medicare Important Message Given:  Yes    [name] P Joash Tony 03/08/2018, 2:52 PM

## 2018-03-08 NOTE — Progress Notes (Signed)
Pt got discharged to home, discharge instructions provided and patient showed understanding to it, IV taken out,Telemonitor DC,pt left unit in wheelchair with all of the belongings accompanied with a family member (Sister)  Palma Holter, Therapist, sports

## 2018-06-26 ENCOUNTER — Inpatient Hospital Stay (HOSPITAL_COMMUNITY)
Admission: EM | Admit: 2018-06-26 | Discharge: 2018-07-09 | DRG: 291 | Disposition: E | Payer: Medicare Other | Attending: Pulmonary Disease | Admitting: Pulmonary Disease

## 2018-06-26 ENCOUNTER — Encounter (HOSPITAL_COMMUNITY): Payer: Self-pay | Admitting: Emergency Medicine

## 2018-06-26 ENCOUNTER — Inpatient Hospital Stay (HOSPITAL_COMMUNITY): Payer: Medicare Other

## 2018-06-26 ENCOUNTER — Emergency Department (HOSPITAL_COMMUNITY): Payer: Medicare Other

## 2018-06-26 DIAGNOSIS — N179 Acute kidney failure, unspecified: Secondary | ICD-10-CM | POA: Diagnosis present

## 2018-06-26 DIAGNOSIS — E785 Hyperlipidemia, unspecified: Secondary | ICD-10-CM | POA: Diagnosis present

## 2018-06-26 DIAGNOSIS — J449 Chronic obstructive pulmonary disease, unspecified: Secondary | ICD-10-CM | POA: Diagnosis present

## 2018-06-26 DIAGNOSIS — Z87891 Personal history of nicotine dependence: Secondary | ICD-10-CM | POA: Diagnosis not present

## 2018-06-26 DIAGNOSIS — I2721 Secondary pulmonary arterial hypertension: Secondary | ICD-10-CM | POA: Diagnosis present

## 2018-06-26 DIAGNOSIS — H409 Unspecified glaucoma: Secondary | ICD-10-CM | POA: Diagnosis present

## 2018-06-26 DIAGNOSIS — Z66 Do not resuscitate: Secondary | ICD-10-CM | POA: Diagnosis present

## 2018-06-26 DIAGNOSIS — I4891 Unspecified atrial fibrillation: Secondary | ICD-10-CM | POA: Diagnosis present

## 2018-06-26 DIAGNOSIS — I132 Hypertensive heart and chronic kidney disease with heart failure and with stage 5 chronic kidney disease, or end stage renal disease: Secondary | ICD-10-CM | POA: Diagnosis present

## 2018-06-26 DIAGNOSIS — N186 End stage renal disease: Secondary | ICD-10-CM | POA: Diagnosis present

## 2018-06-26 DIAGNOSIS — Z992 Dependence on renal dialysis: Secondary | ICD-10-CM | POA: Diagnosis not present

## 2018-06-26 DIAGNOSIS — D631 Anemia in chronic kidney disease: Secondary | ICD-10-CM | POA: Diagnosis present

## 2018-06-26 DIAGNOSIS — G9341 Metabolic encephalopathy: Secondary | ICD-10-CM | POA: Diagnosis present

## 2018-06-26 DIAGNOSIS — I350 Nonrheumatic aortic (valve) stenosis: Secondary | ICD-10-CM | POA: Diagnosis present

## 2018-06-26 DIAGNOSIS — J9601 Acute respiratory failure with hypoxia: Secondary | ICD-10-CM | POA: Diagnosis not present

## 2018-06-26 DIAGNOSIS — I469 Cardiac arrest, cause unspecified: Secondary | ICD-10-CM

## 2018-06-26 DIAGNOSIS — E872 Acidosis: Secondary | ICD-10-CM | POA: Diagnosis present

## 2018-06-26 DIAGNOSIS — I5023 Acute on chronic systolic (congestive) heart failure: Secondary | ICD-10-CM | POA: Diagnosis present

## 2018-06-26 DIAGNOSIS — Z79899 Other long term (current) drug therapy: Secondary | ICD-10-CM | POA: Diagnosis not present

## 2018-06-26 DIAGNOSIS — R570 Cardiogenic shock: Secondary | ICD-10-CM | POA: Diagnosis present

## 2018-06-26 DIAGNOSIS — I420 Dilated cardiomyopathy: Secondary | ICD-10-CM | POA: Diagnosis present

## 2018-06-26 DIAGNOSIS — J810 Acute pulmonary edema: Secondary | ICD-10-CM | POA: Diagnosis not present

## 2018-06-26 DIAGNOSIS — Z4659 Encounter for fitting and adjustment of other gastrointestinal appliance and device: Secondary | ICD-10-CM

## 2018-06-26 DIAGNOSIS — Z888 Allergy status to other drugs, medicaments and biological substances status: Secondary | ICD-10-CM

## 2018-06-26 LAB — I-STAT CHEM 8, ED
BUN: 98 mg/dL — ABNORMAL HIGH (ref 8–23)
Calcium, Ion: 1.12 mmol/L — ABNORMAL LOW (ref 1.15–1.40)
Chloride: 112 mmol/L — ABNORMAL HIGH (ref 98–111)
Creatinine, Ser: 5.7 mg/dL — ABNORMAL HIGH (ref 0.61–1.24)
GLUCOSE: 83 mg/dL (ref 70–99)
HEMATOCRIT: 38 % — AB (ref 39.0–52.0)
HEMOGLOBIN: 12.9 g/dL — AB (ref 13.0–17.0)
Potassium: 4 mmol/L (ref 3.5–5.1)
Sodium: 143 mmol/L (ref 135–145)
TCO2: 17 mmol/L — AB (ref 22–32)

## 2018-06-26 LAB — HEPATIC FUNCTION PANEL
ALT: 59 U/L — AB (ref 0–44)
AST: 59 U/L — ABNORMAL HIGH (ref 15–41)
Albumin: 3.2 g/dL — ABNORMAL LOW (ref 3.5–5.0)
Alkaline Phosphatase: 81 U/L (ref 38–126)
BILIRUBIN DIRECT: 0.3 mg/dL — AB (ref 0.0–0.2)
BILIRUBIN INDIRECT: 0.6 mg/dL (ref 0.3–0.9)
Total Bilirubin: 0.9 mg/dL (ref 0.3–1.2)
Total Protein: 6.2 g/dL — ABNORMAL LOW (ref 6.5–8.1)

## 2018-06-26 LAB — I-STAT ARTERIAL BLOOD GAS, ED
ACID-BASE DEFICIT: 8 mmol/L — AB (ref 0.0–2.0)
Bicarbonate: 19.4 mmol/L — ABNORMAL LOW (ref 20.0–28.0)
O2 SAT: 100 %
PO2 ART: 239 mmHg — AB (ref 83.0–108.0)
TCO2: 21 mmol/L — ABNORMAL LOW (ref 22–32)
pCO2 arterial: 45.9 mmHg (ref 32.0–48.0)
pH, Arterial: 7.234 — ABNORMAL LOW (ref 7.350–7.450)

## 2018-06-26 LAB — CBC
HCT: 39.6 % (ref 39.0–52.0)
HEMATOCRIT: 41.5 % (ref 39.0–52.0)
HEMOGLOBIN: 12.9 g/dL — AB (ref 13.0–17.0)
Hemoglobin: 12.2 g/dL — ABNORMAL LOW (ref 13.0–17.0)
MCH: 30 pg (ref 26.0–34.0)
MCH: 29.3 pg (ref 26.0–34.0)
MCHC: 30.8 g/dL (ref 30.0–36.0)
MCHC: 31.1 g/dL (ref 30.0–36.0)
MCV: 97.5 fL (ref 78.0–100.0)
MCV: 94.1 fL (ref 78.0–100.0)
PLATELETS: 129 10*3/uL — AB (ref 150–400)
Platelets: 125 10*3/uL — ABNORMAL LOW (ref 150–400)
RBC: 4.06 MIL/uL — ABNORMAL LOW (ref 4.22–5.81)
RBC: 4.41 MIL/uL (ref 4.22–5.81)
RDW: 17.4 % — AB (ref 11.5–15.5)
RDW: 17.5 % — ABNORMAL HIGH (ref 11.5–15.5)
WBC: 7.9 10*3/uL (ref 4.0–10.5)
WBC: 9.4 10*3/uL (ref 4.0–10.5)

## 2018-06-26 LAB — TROPONIN I
TROPONIN I: 0.33 ng/mL — AB (ref <0.03)
Troponin I: 0.29 ng/mL (ref <0.03)

## 2018-06-26 LAB — GLUCOSE, CAPILLARY: GLUCOSE-CAPILLARY: 120 mg/dL — AB (ref 70–99)

## 2018-06-26 LAB — BASIC METABOLIC PANEL
Anion gap: 19 — ABNORMAL HIGH (ref 5–15)
BUN: 94 mg/dL — AB (ref 8–23)
CALCIUM: 9.5 mg/dL (ref 8.9–10.3)
CO2: 14 mmol/L — ABNORMAL LOW (ref 22–32)
CREATININE: 5.35 mg/dL — AB (ref 0.61–1.24)
Chloride: 110 mmol/L (ref 98–111)
GFR calc Af Amer: 11 mL/min — ABNORMAL LOW (ref >60)
GFR, EST NON AFRICAN AMERICAN: 9 mL/min — AB (ref >60)
GLUCOSE: 88 mg/dL (ref 70–99)
Potassium: 4 mmol/L (ref 3.5–5.1)
Sodium: 143 mmol/L (ref 135–145)

## 2018-06-26 LAB — I-STAT TROPONIN, ED: TROPONIN I, POC: 0.39 ng/mL — AB (ref 0.00–0.08)

## 2018-06-26 LAB — I-STAT CG4 LACTIC ACID, ED: LACTIC ACID, VENOUS: 5.94 mmol/L — AB (ref 0.5–1.9)

## 2018-06-26 LAB — BRAIN NATRIURETIC PEPTIDE: B Natriuretic Peptide: 4359.9 pg/mL — ABNORMAL HIGH (ref 0.0–100.0)

## 2018-06-26 MED ORDER — FAMOTIDINE IN NACL 20-0.9 MG/50ML-% IV SOLN: 20.0000 mg | Freq: Two times a day (BID) | INTRAVENOUS | Status: DC

## 2018-06-26 MED ORDER — PROPOFOL 1000 MG/100ML IV EMUL: 5.0000 ug/kg/min | INTRAVENOUS | Status: DC

## 2018-06-26 MED ORDER — EPINEPHRINE PF 1 MG/ML IJ SOLN
0.5000 ug/min | INTRAVENOUS | Status: DC
  Administered 2018-06-26: 5 ug/min via INTRAVENOUS
  Filled 2018-06-26 (×2): qty 4

## 2018-06-26 MED ORDER — SODIUM CHLORIDE 0.9 % IV SOLN: INTRAVENOUS | Status: DC

## 2018-06-26 MED ORDER — CHLORHEXIDINE GLUCONATE 0.12% ORAL RINSE (MEDLINE KIT)
15.0000 mL | Freq: Two times a day (BID) | OROMUCOSAL | Status: DC
  Administered 2018-06-26: 15 mL via OROMUCOSAL

## 2018-06-26 MED ORDER — EPINEPHRINE PF 1 MG/10ML IJ SOSY
PREFILLED_SYRINGE | INTRAMUSCULAR | Status: AC | PRN
  Administered 2018-06-26: 1 mg via INTRAVENOUS

## 2018-06-26 MED ORDER — FENTANYL CITRATE (PF) 100 MCG/2ML IJ SOLN
50.0000 ug | INTRAMUSCULAR | Status: DC | PRN
  Administered 2018-06-26 – 2018-06-27 (×2): 50 ug via INTRAVENOUS
  Filled 2018-06-26 (×2): qty 2

## 2018-06-26 MED ORDER — FUROSEMIDE 10 MG/ML IJ SOLN
80.0000 mg | Freq: Once | INTRAMUSCULAR | Status: DC
  Filled 2018-06-26: qty 8

## 2018-06-26 MED ORDER — ORAL CARE MOUTH RINSE
15.0000 mL | OROMUCOSAL | Status: DC
  Administered 2018-06-26 – 2018-06-27 (×5): 15 mL via OROMUCOSAL

## 2018-06-26 MED ORDER — SODIUM BICARBONATE 8.4 % IV SOLN
INTRAVENOUS | Status: AC | PRN
  Administered 2018-06-26: 50 meq via INTRAVENOUS

## 2018-06-26 MED ORDER — FAMOTIDINE IN NACL 20-0.9 MG/50ML-% IV SOLN
20.0000 mg | INTRAVENOUS | Status: DC
  Administered 2018-06-26: 20 mg via INTRAVENOUS
  Filled 2018-06-26: qty 50

## 2018-06-26 MED ORDER — FENTANYL CITRATE (PF) 100 MCG/2ML IJ SOLN
50.0000 ug | INTRAMUSCULAR | Status: DC | PRN
  Administered 2018-06-26 (×2): 50 ug via INTRAVENOUS
  Filled 2018-06-26 (×2): qty 2

## 2018-06-26 MED ORDER — ATORVASTATIN CALCIUM 10 MG PO TABS
10.0000 mg | ORAL_TABLET | Freq: Every day | ORAL | Status: DC
  Filled 2018-06-26: qty 1

## 2018-06-26 MED ORDER — LATANOPROST 0.005 % OP SOLN
1.0000 [drp] | Freq: Every day | OPHTHALMIC | Status: DC
  Administered 2018-06-26: 1 [drp] via OPHTHALMIC
  Filled 2018-06-26: qty 2.5

## 2018-06-26 MED ORDER — HEPARIN SODIUM (PORCINE) 5000 UNIT/ML IJ SOLN
5000.0000 [IU] | Freq: Three times a day (TID) | INTRAMUSCULAR | Status: DC
  Administered 2018-06-26 – 2018-06-27 (×2): 5000 [IU] via SUBCUTANEOUS
  Filled 2018-06-26 (×2): qty 1

## 2018-06-26 MED ORDER — CALCIUM CHLORIDE 10 % IV SOLN
INTRAVENOUS | Status: AC | PRN
  Administered 2018-06-26: 1 g via INTRAVENOUS

## 2018-06-26 MED ORDER — EPINEPHRINE PF 1 MG/10ML IJ SOSY
PREFILLED_SYRINGE | INTRAMUSCULAR | Status: AC | PRN
  Administered 2018-06-26: 1 via INTRAVENOUS

## 2018-06-26 MED ORDER — ASPIRIN 81 MG PO CHEW: 81.0000 mg | CHEWABLE_TABLET | Freq: Every day | ORAL | Status: DC

## 2018-06-26 MED ORDER — PROPOFOL 1000 MG/100ML IV EMUL
INTRAVENOUS | Status: AC
  Filled 2018-06-26: qty 100

## 2018-06-26 MED ORDER — SODIUM BICARBONATE 8.4 % IV SOLN
INTRAVENOUS | Status: AC | PRN
  Administered 2018-06-26: 100 meq via INTRAVENOUS

## 2018-06-26 MED ORDER — SODIUM CHLORIDE 0.9 % IV SOLN: 250.0000 mL | INTRAVENOUS | Status: DC | PRN

## 2018-06-26 MED ORDER — ASPIRIN 81 MG PO CHEW: 324.0000 mg | CHEWABLE_TABLET | ORAL | Status: DC

## 2018-06-26 MED ORDER — ASPIRIN 300 MG RE SUPP: 300.0000 mg | RECTAL | Status: DC

## 2018-06-26 NOTE — ED Notes (Signed)
CCM paged to Dr. Rogene Houston @ [phone].

## 2018-06-26 NOTE — H&P (Signed)
NAME:  Rick Tyler, MRN:  [id number], DOB:  [birth year], LOS: 0 ADMISSION DATE:  06/22/2018, CONSULTATION DATE: 9/18 REFERRING MD: Venita Sheffield, CHIEF COMPLAINT: Cardiac arrest  Brief History   77 year old male patient with stage IV chronic kidney disease, chronic systolic heart failure and hypertension.  Refused dialysis as outpatient.  Presents 9/18 to the emergency room status post PEA arrest.  Estimated time to ROSC 10 minutes by EMS, complicated by 2 brief arrests in the emergency room estimated approximately 4 minutes total.  Admitted to intensive care on epinephrine infusion, following commands at time of critical care arrival [facility] Events   9/18: Awake, following commands, maintained on epinephrine infusion.  Spoke to his sister Alvan Dame, Landry Mellow who informed team that patient does not want analysis under any circumstances, would not want to be maintained on life support, and requested DO NOT RESUSCITATE status with plan for one-way extubation after attempts at maximizing cardiac output  Consults: date of consult/date signed off & final recs:  Not applicable Procedures (surgical and bedside):  Endotracheal tube placed 9/18 Significant Diagnostic Tests:  Not applicable  Micro Data: Not applicable  Antimicrobials:  Not applicable  Subjective:  Appears comfortable Objective   Blood pressure (Abnormal) 113/97, resp. rate (Abnormal) 28, height 6\' 1"  (1.854 m).    Vent Mode: PRVC FiO2 (%):  [80 %-100 %] 80 % Set Rate:  [18 bmp-28 bmp] 28 bmp Vt Set:  [640 mL] 640 mL PEEP:  [5 cmH20] 5 cmH20 Plateau Pressure:  [29 cmH20] 29 cmH20  No intake or output data in the 24 hours ending 06/15/2018 1511 There were no vitals filed for this visit.  Examination: General: Chronically ill-appearing 77 year old male currently interactive on the ventilator HENT: Orally intubated no JVD pupils equal reactive Lungs: Diffuse rales and rhonchi no accessory use Cardiovascular regular  irregular with atrial fibrillation on telemetry Abdomen: Soft nontender Extremities: Lower extremity edema Neuro: Awake moves all extremities GU: Due to void  Resolved Hospital Problem list    Assessment & Plan:   PEA arrest.  Likely primary cardiac event  with progressive pulmonary edema possibly exacerbated by hypoxia, now in cardiogenic shock History of hypertension, known dilated cardiomyopathy and hyperlipidemia -Known history of dilated cardiomyopathy with EF in May 2019 at 4045% but also with significant pulmonary artery hypertension and both right and left atrial enlargement -Followed by Dr. Doylene Canard in the outpatient setting -Patient has clearly dictated to his family that he does not want heroic measures or to be on life support Plan Admit to the intensive care DO NOT RESUSCITATE with no escalation of vasoactive drips Wean epinephrine for mean arterial pressure of 65 Continue telemetry monitoring Lasix x1 Ideally needs CRRT, and aggressive treatment of heart failure however patient is clearly directed his instructions otherwise  History of atrial fibrillation CHADSVASC2 score of 5.  Not on anticoagulation due to life-threatening GI bleed Plan Continuing telemetry monitoring Holding amiodarone for now  Acute hypoxic respiratory failure setting of pulmonary edema and cardiopulmonary arrest Chest x-ray shows endotracheal tube in satisfactory position.  There is bilateral diffuse pulmonary edema, there is postoperative scarring noted on the right lung which is consistent with prior imaging Plan Admit to the intensive care Full ventilator support VAP bundle PAD protocol RASS goal 0 Lasix Assess for weaning in the morning on 9/19.  Family does not wish to prolong mechanical ventilation and would request one-way extubation after spending the evening trying to maximize pulmonary status Would quickly transition to comfort post extubation  End-stage renal disease/CKD stage  IV. -Sounds as though he has been becoming progressively uremic over the past several weeks to months -Has declined dialysis, this is been verified by not only his sister but his 2 nephews who are at bedside Plan Attempting to maximize cardiac output Diuresis as tolerated Not a candidate for dialysis No role for nephrology  Mild lactic acidosis status post cardiac arrest This is not sepsis Plan Discontinue further lactic acid monitoring  Anemia of chronic disease Plan Trend CBC Given advanced directives would not transfuse   Mild acute metabolic encephalopathy He is status post cardiac arrest.  Time to ROSC estimated initially at 10 minutes -He is following commands Plan Admit to intensive care Treat fever Not indicated for temperature management protocol Supportive care  Disposition / Summary of Today's Plan 06/19/2018   Admitting to intensive care.  As he is now been through CPR 3 times will try to optimize his pulmonary status and hemodynamics overnight.  His family is all in agreement that he would not want invasive or heroic measures and feel quite strong about the ventilator as well.  We will try to diurese him, mobilize some fluid, optimize his hemodynamics, and keep him comfortable.  Plan for one-way extubation on 9/19, family understands he may not survive through the night and we may need to transition rapidly to palliative measures post extubation    Diet: Nothing by mouth Pain/Anxiety/Delirium protocol (if indicated): PAD protocol started VAP protocol (if indicated) started in the emergency room DVT prophylaxis: Subcutaneous heparin GI prophylaxis: H2 blockade Hyperglycemia protocol: Not applicable  Mobility: Bedrest Code Status: Full DO NOT RESUSCITATE Family Communication: Goals of care discussed with his Sister Alvan Dame [phone], also his 2 nephews Percell Miller and Quillian Quince both at bedside they understand he is terminally ill.  Plan will be supportive in nature,  try to optimize hemodynamics and pulmonary status with plan for one-way extubation in the morning on 9/19  Labs   CBC: Recent Labs  Lab 06/15/2018 1323 06/18/2018 1327  WBC 7.9  --   HGB 12.2* 12.9*  HCT 39.6 38.0*  MCV 97.5  --   PLT 129*  --    Basic Metabolic Panel: Recent Labs  Lab 07/05/2018 1323 06/12/2018 1327  NA 143 143  K 4.0 4.0  CL 110 112*  CO2 14*  --   GLUCOSE 88 83  BUN 94* 98*  CREATININE 5.35* 5.70*  CALCIUM 9.5  --    GFR: CrCl cannot be calculated (Unknown ideal weight.). Recent Labs  Lab 06/11/2018 1323 06/20/2018 1327  WBC 7.9  --   LATICACIDVEN  --  5.94*   Liver Function Tests: Recent Labs  Lab 06/29/2018 1323  AST 59*  ALT 59*  ALKPHOS 81  BILITOT 0.9  PROT 6.2*  ALBUMIN 3.2*   No results for input(s): LIPASE, AMYLASE in the last 168 hours. No results for input(s): AMMONIA in the last 168 hours. ABG    Component Value Date/Time   PHART 7.234 (L) 06/22/2018 1411   PCO2ART 45.9 06/17/2018 1411   PO2ART 239.0 (H) 06/15/2018 1411   HCO3 19.4 (L) 07/07/2018 1411   TCO2 21 (L) 06/30/2018 1411   ACIDBASEDEF 8.0 (H) 07/03/2018 1411   O2SAT 100.0 06/17/2018 1411    Coagulation Profile: No results for input(s): INR, PROTIME in the last 168 hours. Cardiac Enzymes: No results for input(s): CKTOTAL, CKMB, CKMBINDEX, TROPONINI in the last 168 hours. HbA1C: No results found for: HGBA1C CBG: No results for input(s): GLUCAP  in the last 168 hours.  Admitting History of Present Illness.   77 year old male patient with a known history of dilated cardiomyopathy, EF 45 to 50%  by last echocardiogram May 2019 with mild hypokinesis of the anterior septal and inferior septal myocardium, mild aortic stenosis, and both right and left atrial dilation.  He has a history of end-stage renal disease and was told by cardiology more than 2 years ago but the patient adamantly refused.  He lives with his sister who is a retired Equities trader.  She reports to me that  he has been declining for over 12 months, but more acutely over the last 6 weeks and then even more acutely over the last week.  His symptoms have been primarily worsening activity tolerance, poor p.o. intake, weight loss, and marked increase in exertional dyspnea.  He apparently was in route to a [employer], he was found unresponsive outside in the parking lot, CPR was initiated with ROSC estimated at 10 minutes.  He was intubated by EMS and transferred to the emergency room.  In the ER he suffered 2 more PEA arrests with an estimated downtime of 4 minutes and total he was treated empirically with calcium chloride and bicarbonate in addition to epinephrine pushes, ultimately circulation maintained on epinephrine infusion.  On time of critical care arrival the patient is responsive, following commands, but remains on high doses of epinephrine and is mechanically ventilated.  Critical care asked to admit. Review of Systems:   Unable Past medical history  He,  has a past medical history of A-fib ([place]), Cancer ([place]), CHF (congestive heart failure) ([place]), Dyspnea, GI bleed, Glaucoma, Hemoptysis, HTN (hypertension), Hyperlipidemia, and Renal insufficiency.  Marked increase  Surgical History    Past Surgical History:  Procedure Laterality Date  . BRONCHOSCOPY  10/11/2009  . r vats  10/15/2008     Social History   Social History   Socioeconomic History  . Marital status: Single    Spouse name: Not on file  . Number of children: Not on file  . Years of education: Not on file  . Highest education level: Not on file  Occupational History  . Not on file  Social Needs  . Financial resource strain: Not hard at all  . Food insecurity:    Worry: Patient refused    Inability: Patient refused  . Transportation needs:    Medical: Patient refused    Non-medical: Patient refused  Tobacco Use  . Smoking status: Former Research scientist (life sciences)  . Smokeless tobacco: Never Used  . Tobacco comment: Smoked 1 PPD for  40 years; quit several years ago   Substance and Sexual Activity  . Alcohol use: Yes    Alcohol/week: 6.0 standard drinks    Types: 6 Cans of beer per week    Comment: not on a regular basis  . Drug use: No  . Sexual activity: Not Currently  Lifestyle  . Physical activity:    Days per week: Not on file    Minutes per session: Not on file  . Stress: Not on file  Relationships  . Social connections:    Talks on phone: Not on file    Gets together: Not on file    Attends religious service: Not on file    Active member of club or organization: Not on file    Attends meetings of clubs or organizations: Not on file    Relationship status: Not on file  . Intimate partner violence:    Fear  of current or ex partner: Not on file    Emotionally abused: Not on file    Physically abused: Not on file    Forced sexual activity: Not on file  Other Topics Concern  . Not on file  Social History Narrative  . Not on file  ,  reports that he has quit smoking. He has never used smokeless tobacco. He reports that he drinks about 6.0 standard drinks of alcohol per week. He reports that he does not use drugs.   Family history   His family history is not on file.   Allergies Allergies  Allergen Reactions  . Warfarin Sodium Other (See Comments)    REACTION: Free Bleeder    Home meds  Prior to Admission medications   Medication Sig Start Date End Date Taking? Authorizing Provider  acetaminophen-codeine (TYLENOL #3) 300-30 MG tablet Take 1 tablet by mouth daily as needed for pain. 02/14/18   [provider]  albuterol (PROVENTIL) (2.5 MG/3ML) 0.083% nebulizer solution Take 3 mLs (2.5 mg total) by nebulization every 6 (six) hours as needed for wheezing or shortness of breath. 03/08/18   Dixie Dials, MD  amiodarone (PACERONE) 200 MG tablet Take 200 mg by mouth daily.     [provider]  amLODipine (NORVASC) 5 MG tablet Take 1 tablet (5 mg total) by mouth daily. 03/09/18   Dixie Dials, MD  atorvastatin (LIPITOR) 10 MG tablet Take 10 mg by mouth daily. 06/06/18   [provider]  benzonatate (TESSALON) 100 MG capsule Take 1 capsule (100 mg total) by mouth 3 (three) times daily. 03/08/18   Dixie Dials, MD  carvedilol (COREG) 12.5 MG tablet Take 12.5 mg by mouth 2 (two) times daily with a meal.     [provider]  furosemide (LASIX) 80 MG tablet Take 80 mg by mouth 3 (three) times daily.     [provider]  hydrALAZINE (APRESOLINE) 25 MG tablet Take 1 tablet (25 mg total) by mouth 3 (three) times daily. 03/08/18   Dixie Dials, MD  latanoprost (XALATAN) 0.005 % ophthalmic solution Place 1 drop into both eyes daily. 02/19/17   [provider]  LORazepam (ATIVAN) 0.5 MG tablet Take 0.5 mg by mouth daily as needed for anxiety or seizure.  01/10/18   [provider]  nitroGLYCERIN (NITROSTAT) 0.4 MG SL tablet Place 0.4 mg under the tongue every 5 (five) minutes as needed for chest pain.     [provider]  potassium chloride (K-DUR) 10 MEQ tablet Take 1 tablet (10 mEq total) by mouth daily. 03/08/18   Dixie Dials, MD  VENTOLIN HFA 108 (90 Base) MCG/ACT inhaler Inhale 2 puffs into the lungs every 6 (six) hours as needed for shortness of breath or wheezing. 05/10/18   [provider]     Erick Colace ACNP-BC [place] Pager # [phone] OR # [phone] if no answer

## 2018-06-26 NOTE — ED Notes (Signed)
Pt's nephew taken to consultation room B.

## 2018-06-26 NOTE — Progress Notes (Signed)
RT note-Vent changes post ABG.

## 2018-06-26 NOTE — ED Notes (Signed)
Ice bags applied at 1345

## 2018-06-26 NOTE — Progress Notes (Signed)
RT Note: RR increased bo 28 per Dr. Rogene Houston

## 2018-06-26 NOTE — ED Notes (Signed)
Pt arrives to [facility] being bagged via ET tube placed by [facility]- ems reports 10 minutes of CPR push 1 mg epi and started epi drip with return of pulses after approx 10-15 minutes.  Ems states a bystander seen him in the car and looked like he was waiting for someone- they looked again and appeared pt was not moving- call ems.   Epi drip infusing  8.0 ET tube placed by ems

## 2018-06-26 NOTE — ED Provider Notes (Addendum)
[facility] EMERGENCY DEPARTMENT Provider Note   CSN: [id number] Arrival date & time: 07/06/2018  1315     History   Chief Complaint Chief Complaint  Patient presents with  . Cardiac Arrest    HPI Rick Tyler is a 77 y.o. male.  Patient with a witnessed arrest by bystanders.  Not clear whether bystander started CPR.  But EMS did start CPR.  Patient without a pulse not breathing was intubated.  Initial rhythm seemed to be kind of a pulseless electrical activity.  Patient was given epinephrine had an IO started.  Then had a return of spontaneous circulation however did not wake up.  Loss that upon arrival here.  CPR restarted.  Patient was at a restaurant and apparently had been sitting on the hood of his car when he slumped over.  There was also some report that he was in the backseat of the car.  Patient and friend arrived shortly afterwards stated that he has been giving him food rest of family members that had gone to [place] for family reunion.  And that this patient had been sick for a few days and not eating very much for the past 2 days.  He stated that he thought that he was supposed to have dialysis but patient had opted not to get it about 2 years ago.  He also stated that he had a history of congestive heart failure.  Was not aware of any limited resuscitation status.     Past Medical History:  Diagnosis Date  . A-fib ([place])   . Cancer ([place])   . CHF (congestive heart failure) ([place])   . Dyspnea   . GI bleed   . Glaucoma   . Hemoptysis   . HTN (hypertension)   . Hyperlipidemia   . Renal insufficiency     Patient Active Problem List   Diagnosis Date Noted  . Acute systolic heart failure ([place]) 03/04/2018  . Bilateral leg edema 02/23/2017  . CKD (chronic kidney disease), stage IV ([place]) 02/23/2017  . Acute GI bleeding 04/26/2016  . Acute on chronic left systolic heart failure ([place]) 04/26/2016  . Hemoptysis   . Renal insufficiency   . COPD  01/21/2010  . 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 Essential hypertension 11/22/2009  . HEMOPTYSIS UNSPECIFIED 11/22/2009    Past Surgical History:  Procedure Laterality Date  . BRONCHOSCOPY  10/11/2009  . r vats  10/15/2008        Home Medications    Prior to Admission medications   Medication Sig Start Date End Date Taking? Authorizing Provider  acetaminophen-codeine (TYLENOL #3) 300-30 MG tablet Take 1 tablet by mouth daily as needed for pain. 02/14/18   [provider]  albuterol (PROVENTIL) (2.5 MG/3ML) 0.083% nebulizer solution Take 3 mLs (2.5 mg total) by nebulization every 6 (six) hours as needed for wheezing or shortness of breath. 03/08/18   Dixie Dials, MD  amiodarone (PACERONE) 200 MG tablet Take 200 mg by mouth daily.     [provider]  amLODipine (NORVASC) 5 MG tablet Take 1 tablet (5 mg total) by mouth daily. 03/09/18   Dixie Dials, MD  atorvastatin (LIPITOR) 10 MG tablet Take 10 mg by mouth daily. 06/06/18   [provider]  benzonatate (TESSALON) 100 MG capsule Take 1 capsule (100 mg total) by mouth 3 (three) times daily. 03/08/18   Dixie Dials, MD  carvedilol (COREG) 12.5 MG tablet Take 12.5 mg by mouth  2 (two) times daily with a meal.     [provider]  furosemide (LASIX) 80 MG tablet Take 80 mg by mouth 3 (three) times daily.     [provider]  hydrALAZINE (APRESOLINE) 25 MG tablet Take 1 tablet (25 mg total) by mouth 3 (three) times daily. 03/08/18   Dixie Dials, MD  latanoprost (XALATAN) 0.005 % ophthalmic solution Place 1 drop into both eyes daily. 02/19/17   [provider]  LORazepam (ATIVAN) 0.5 MG tablet Take 0.5 mg by mouth daily as needed for anxiety or seizure.  01/10/18   [provider]  nitroGLYCERIN (NITROSTAT) 0.4 MG SL tablet Place 0.4 mg under the tongue every 5 (five) minutes as needed for chest pain.     [provider]  potassium chloride (K-DUR) 10 MEQ  tablet Take 1 tablet (10 mEq total) by mouth daily. 03/08/18   Dixie Dials, MD  VENTOLIN HFA 108 (90 Base) MCG/ACT inhaler Inhale 2 puffs into the lungs every 6 (six) hours as needed for shortness of breath or wheezing. 05/10/18   [provider]    Family History No family history on file.  Social History Social History   Tobacco Use  . Smoking status: Former Research scientist (life sciences)  . Smokeless tobacco: Never Used  . Tobacco comment: Smoked 1 PPD for 40 years; quit several years ago   Substance Use Topics  . Alcohol use: Yes    Alcohol/week: 6.0 standard drinks    Types: 6 Cans of beer per week    Comment: not on a regular basis  . Drug use: No     Allergies   Warfarin sodium   Review of Systems Review of Systems  Unable to perform ROS: Intubated     Physical Exam Updated Vital Signs BP (!) 113/97   Resp (!) 28   Ht 1.854 m (6\' 1" )   BMI 21.56 kg/m   Physical Exam  HENT:  Endotracheal tube in place  Eyes:  Pupils about 3 mm slightly reactive bilaterally  Neck: Neck supple.  Cardiovascular:  No palpable pulse  Pulmonary/Chest:  Patient being bagged with Ambu bag good breath sounds bilaterally but better on the left than right  Abdominal: He exhibits no distension.  Musculoskeletal: He exhibits edema.  Bilateral lower extremity edema.  I/O proximal left leg  Neurological:  Not responsive  Nursing note and vitals reviewed.    ED Treatments / Results  Labs (all labs ordered are listed, but only abnormal results are displayed) Labs Reviewed  CBC - Abnormal; Notable for the following components:      Result Value   RBC 4.06 (*)    Hemoglobin 12.2 (*)    RDW 17.4 (*)    Platelets 129 (*)    All other components within normal limits  BASIC METABOLIC PANEL - Abnormal; Notable for the following components:   CO2 14 (*)    BUN 94 (*)    Creatinine, Ser 5.35 (*)    GFR calc non Af Amer 9 (*)    GFR calc Af Amer 11 (*)    Anion gap 19 (*)    All other  components within normal limits  HEPATIC FUNCTION PANEL - Abnormal; Notable for the following components:   Total Protein 6.2 (*)    Albumin 3.2 (*)    AST 59 (*)    ALT 59 (*)    Bilirubin, Direct 0.3 (*)    All other components within normal limits  I-STAT CHEM 8,  ED - Abnormal; Notable for the following components:   Chloride 112 (*)    BUN 98 (*)    Creatinine, Ser 5.70 (*)    Calcium, Ion 1.12 (*)    TCO2 17 (*)    Hemoglobin 12.9 (*)    HCT 38.0 (*)    All other components within normal limits  I-STAT CG4 LACTIC ACID, ED - Abnormal; Notable for the following components:   Lactic Acid, Venous 5.94 (*)    All other components within normal limits  I-STAT TROPONIN, ED - Abnormal; Notable for the following components:   Troponin i, poc 0.39 (*)    All other components within normal limits  I-STAT ARTERIAL BLOOD GAS, ED - Abnormal; Notable for the following components:   pH, Arterial 7.234 (*)    pO2, Arterial 239.0 (*)    Bicarbonate 19.4 (*)    TCO2 21 (*)    Acid-base deficit 8.0 (*)    All other components within normal limits  TRIGLYCERIDES    EKG EKG Interpretation  Date/Time:  Wednesday June 26 2018 13:34:14 EDT Ventricular Rate:  89 PR Interval:    QRS Duration: 120 QT Interval:  480 QTC Calculation: 584 R Axis:   60 Text Interpretation:  Long QTc Undetermined rhythm Non-specific intra-ventricular conduction delay Nonspecific ST and T wave abnormality Abnormal ECG Confirmed by Fredia Sorrow [phone]) on 06/14/2018 1:47:35 PM   Radiology Dg Chest Portable 1 View  Result Date: 07/02/2018 CLINICAL DATA:  Syncopal episode at [place].  Bradycardic. EXAM: PORTABLE CHEST 1 VIEW COMPARISON:  Chest radiograph Feb 25, 2018 FINDINGS: Endotracheal tube tip projects 3 cm above the carina. Multiple EKG lines overlie the patient and may obscure subtle underlying pathology. Stable cardiomegaly. Calcified aortic arch. Stable RIGHT lung volume loss, in part reflecting  postsurgical changes. Acute on chronic interstitial prominence, confluent RIGHT upper lobe. RIGHT costophrenic angle pleural thickening without pleural effusion. Biapical pleural thickening. No pneumothorax. Soft tissue planes and included osseous structures are non suspicious. IMPRESSION: 1. Endotracheal tube tip projects 3 cm above the carina. 2. Acute on chronic interstitial changes confluent in RIGHT upper lobe seen concerning for heart failure. 3. Stable postoperative changes scarring RIGHT lung. 4. Stable cardiomegaly. Aortic Atherosclerosis (ICD10-I70.0). Electronically Signed   By: Elon Alas M.D.   On: 06/22/2018 14:12    Procedures Procedures (including critical care time)  CRITICAL CARE Performed by: Fredia Sorrow Total critical care time: 60 minutes Critical care time was exclusive of separately billable procedures and treating other patients. Critical care was necessary to treat or prevent imminent or life-threatening deterioration. Critical care was time spent personally by me on the following activities: development of treatment plan with patient and/or surrogate as well as nursing, discussions with consultants, evaluation of patient's response to treatment, examination of patient, obtaining history from patient or surrogate, ordering and performing treatments and interventions, ordering and review of laboratory studies, ordering and review of radiographic studies, pulse oximetry and re-evaluation of patient's condition.   Medications Ordered in ED Medications  propofol (DIPRIVAN) 1000 MG/100ML infusion (has no administration in time range)  EPINEPHrine (ADRENALIN) 4 mg in dextrose 5 % 250 mL (0.016 mg/mL) infusion (9 mcg/min Intravenous Rate/Dose Change 07/01/2018 1434)  EPINEPHrine (ADRENALIN) 1 MG/10ML injection (1 mg Intravenous Given 06/30/2018 1319)  sodium bicarbonate injection (100 mEq Intravenous Given 06/15/2018 1323)  calcium chloride injection (1 g Intravenous Given  07/06/2018 1323)  EPINEPHrine (ADRENALIN) 1 MG/10ML injection (1 Syringe Intravenous Given 07/01/2018 1356)  sodium bicarbonate injection (50  mEq Intravenous Given 07/07/2018 1358)     Initial Impression / Assessment and Plan / ED Course  I have reviewed the triage vital signs and the nursing notes.  Pertinent labs & imaging results that were available during my care of the patient were reviewed by me and considered in my medical decision making (see chart for details).    EMS had given patient 1 epi and actually it started him on an epinephrine drip when he arrived.  Patient did not have a palpable pulse with CPR was restarted.  He was intubated so bagging ensued.  Patient given another dose of epinephrine.  Also he had heard this story of concerns for needing to be a dialysis patient so patient received bicarb and calcium chloride IV as well.  This resulted again in a spontaneous return of circulation.  Not particularly waking up immediately.  I-STAT showed actually the potassium was 4.  This was done prior to these medications so patient probably was not hyperkalemic.  Did show signs of elevated creatinine in the fives.  So he certainly had renal insufficiency.  Following the return of circulation critical care was called.  Chest x-ray was done without any significant abnormalities.  Patient's lactic acid was elevated as expected.  After about 15 to 20 minutes epinephrine seem to wear off patient lost palpable pulses CPR was started again.  Initial CPR by EMS was probably about 10 minutes worth.  Our initial CPR here in the emergency department was probably also about 10 minutes.  The second set CPR here in the emergency department was probably only for couple minutes.  We gave him epinephrine heart rate came back pulse came back patient actually started to move his arms some would cough and try to sit up some.  Patient started on epinephrine drip originally at 5 and then increased to 9.  This seemed to  hold him.  Discussed with critical care after the first return of circulation they recommended go ahead and cooling the patient since he was not necessarily all that awake and following any commands.  But that got stopped because after the other times he he was sort of doing things he would even squeeze her hand.  Critical care came and saw the patient.  They discussed situation with the daughter who was traveling to [place].  Patient was supposed to be a DNR but we did not have any paperwork.  Patient made DNR by critical care.  They will admit and we withhold any further interventions at this time.  Patient's initial blood gas showed was fairly acidotic PCO2 was about 45 PO2 was excellent.  Ventilator was adjusted to increase his respiratory rate during the oxygen down some.  Also with a second round of epinephrine here patient did receive another amp of bicarb.  That because of the cardiac arrest at this time is not clear.   Final Clinical Impressions(s) / ED Diagnoses   Final diagnoses:  Cardiac arrest [facility])    ED Discharge Orders    None       Fredia Sorrow, MD 06/21/2018 1453    Fredia Sorrow, MD 06/15/2018 [phone]

## 2018-06-26 NOTE — Code Documentation (Signed)
Pulse check- Return on pulses.

## 2018-06-26 NOTE — ED Notes (Signed)
Ice packs removed from Pt per CCm NP, pt actually following commands

## 2018-06-26 NOTE — Progress Notes (Signed)
   06/10/2018 1359  Clinical Encounter Type  Visited With Patient;Family  Visit Type Initial  Referral From Nurse  Spiritual Encounters  Spiritual Needs Emotional  Stress Factors  Patient Stress Factors None identified  Family Stress Factors None identified   Respond to relieving previous Chaplain for PT in Trauma C. PT was unresponsive and family member was outside room. Family member stated he alerted extended family who are in transit to Hospital. Family member was being informed of the status of PT from Doctor. Chaplain performed [facility]. Will follow up as family arrives. Chaplain available as needed.  Chaplain Fidel Levy

## 2018-06-26 NOTE — Progress Notes (Signed)
RT Note: Pt transported to [facility] still intubated and vented. Pt stable t/o transport,  No distress noted.

## 2018-06-26 NOTE — Progress Notes (Signed)
RT note- rate change post 2nd round of CPR. Placed back to vent

## 2018-06-26 NOTE — ED Notes (Signed)
Attempted report 

## 2018-06-26 NOTE — Code Documentation (Signed)
Upon pulse check in trauma bay pt found to have no pulses- CPR started.

## 2018-06-26 NOTE — ED Notes (Signed)
Chaplain paged for family. 

## 2018-06-27 ENCOUNTER — Inpatient Hospital Stay (HOSPITAL_COMMUNITY): Payer: Medicare Other

## 2018-06-27 LAB — BASIC METABOLIC PANEL
Anion gap: 20 — ABNORMAL HIGH (ref 5–15)
BUN: 107 mg/dL — AB (ref 8–23)
CALCIUM: 9.6 mg/dL (ref 8.9–10.3)
CO2: 16 mmol/L — ABNORMAL LOW (ref 22–32)
CREATININE: 5.5 mg/dL — AB (ref 0.61–1.24)
Chloride: 108 mmol/L (ref 98–111)
GFR calc Af Amer: 10 mL/min — ABNORMAL LOW (ref >60)
GFR, EST NON AFRICAN AMERICAN: 9 mL/min — AB (ref >60)
Glucose, Bld: 123 mg/dL — ABNORMAL HIGH (ref 70–99)
POTASSIUM: 3.8 mmol/L (ref 3.5–5.1)
SODIUM: 144 mmol/L (ref 135–145)

## 2018-06-27 LAB — POCT I-STAT 3, ART BLOOD GAS (G3+)
ACID-BASE DEFICIT: 8 mmol/L — AB (ref 0.0–2.0)
BICARBONATE: 14 mmol/L — AB (ref 20.0–28.0)
O2 SAT: 97 %
PO2 ART: 85 mmHg (ref 83.0–108.0)
Patient temperature: 98
TCO2: 15 mmol/L — AB (ref 22–32)
pCO2 arterial: 20.5 mmHg — ABNORMAL LOW (ref 32.0–48.0)
pH, Arterial: 7.44 (ref 7.350–7.450)

## 2018-06-27 LAB — CBC
HCT: 36.5 % — ABNORMAL LOW (ref 39.0–52.0)
Hemoglobin: 11.8 g/dL — ABNORMAL LOW (ref 13.0–17.0)
MCH: 29.5 pg (ref 26.0–34.0)
MCHC: 32.3 g/dL (ref 30.0–36.0)
MCV: 91.3 fL (ref 78.0–100.0)
PLATELETS: 118 10*3/uL — AB (ref 150–400)
RBC: 4 MIL/uL — AB (ref 4.22–5.81)
RDW: 17 % — AB (ref 11.5–15.5)
WBC: 9.2 10*3/uL (ref 4.0–10.5)

## 2018-06-27 LAB — TROPONIN I: Troponin I: 0.43 ng/mL (ref <0.03)

## 2018-06-27 LAB — PHOSPHORUS: Phosphorus: 6.2 mg/dL — ABNORMAL HIGH (ref 2.5–4.6)

## 2018-06-27 LAB — GLUCOSE, CAPILLARY: GLUCOSE-CAPILLARY: 107 mg/dL — AB (ref 70–99)

## 2018-06-27 LAB — MAGNESIUM: Magnesium: 2 mg/dL (ref 1.7–2.4)

## 2018-06-27 MED FILL — Medication: Qty: 1 | Status: AC

## 2018-06-28 ENCOUNTER — Telehealth: Payer: Self-pay | Admitting: *Deleted

## 2018-06-28 NOTE — Telephone Encounter (Signed)
Received Death certificate received from [employer]. [place] to be signe by provider for cremation. D/C Forwarded to providers location to be signed.

## 2018-07-01 NOTE — Telephone Encounter (Signed)
Received signed death certificate back from Dr Halford Chessman, called [facility], spoke with Levada Dy to let them know it is ready for pickup 07/01/18 & faxed a copy at 12:15pm   LM

## 2018-07-09 NOTE — Progress Notes (Signed)
Nurse called to say patient actively dying and would like chaplain to come be with patient .  Chaplain prayed with patient for peace and for end of life matters and for him to feel the love of God and their family and friends  surrounding them during this time of their departing life on earth.  Was finishing prayer as the monitor began to beep. Conard Novak, Chaplain   06/29/18 0700  Clinical Encounter Type  Visited With Patient  Visit Type Critical Care;Death;Patient actively dying  Referral From Nurse  Consult/Referral To Chaplain  Spiritual Encounters  Spiritual Needs Prayer;Emotional  Stress Factors  Patient Stress Factors None identified;Health changes;Other (Comment) (actively dying)

## 2018-07-09 NOTE — Progress Notes (Signed)
Jerene Pitch, NP in department states pt is not a ME case. This RN and nurse Vaughan Basta at bedside, pt taken of ventilator support and epinephrine drip stopped.  Verified pt with no audible or visible breath sounds, no audible heart sounds or palpable pulse. TOD 0715

## 2018-07-09 NOTE — Progress Notes (Addendum)
Received pt from [place]. Pt orally intubated, equal hand grip and equal BLE dorsiflexion/plantarflexion to command, no eye opening. RPIV with epinephrine drip 5 mcg. Atrial fib hr 80s noted on cardiac monitor.

## 2018-07-09 NOTE — Progress Notes (Signed)
PT sister Alvan Dame returned call to comment pt nephew Quillian Quince lives local and is in route to hospital to offer support to pt. This RN offered to call hospital chaplin. Chaplin notified per Pennelope Bracken ok.

## 2018-07-09 NOTE — Progress Notes (Addendum)
2 radiology techs just performed pcxr when this rn notes pt now appears restless and irritable squinying eyes open trying to speak and gesture with hands, pt follows no commands at this time. Pt does not nod head to direct questions. Pt with pisodes  of bradycardia hr 40s. RR 30s, RT to bedside and vent changes made. Fentanyl 50 mcg IVP given. Called [facility] nurse with updates on pt condition.

## 2018-07-09 NOTE — Progress Notes (Signed)
Called [facility] regarding abg results, Dr Deterding reviewed abgs results

## 2018-07-09 NOTE — Progress Notes (Signed)
This RN called pt sister Alvan Dame at [phone] to give updates on pt condition. Manus Gunning maintains pt DNR status stating pt has been "refusing medical care" and "doing poorly for several years". Emotional support given, needs addressed and questions encouraged. Need to notify pt nephew assessed, Manus Gunning will notify pt nephew Quillian Quince.

## 2018-07-09 NOTE — Progress Notes (Signed)
815 vent check removed from chart.  Pt deceased.  RT stopped vent.

## 2018-07-09 NOTE — Progress Notes (Signed)
Patient deceased. Pulled ET tube and removed vent from the room.

## 2018-07-09 DEATH — deceased

## 2018-08-09 NOTE — Discharge Summary (Signed)
77 year old male patient with stage IV chronic kidney disease, chronic systolic heart failure and hypertension.  Refused dialysis as outpatient.  Presents 9/18 to the emergency room status post PEA arrest.  Estimated time to ROSC 10 minutes by EMS, complicated by 2 brief arrests in the emergency room estimated approximately 4 minutes total.  Admitted to intensive care on epinephrine infusion, following commands at time of critical care arrival   9/18: Awake, following commands, maintained on epinephrine infusion.  Spoke to his sister Alvan Dame, Landry Mellow who informed team that patient does not want analysis under any circumstances, would not want to be maintained on life support, and requested DO NOT RESUSCITATE status. Shortly after admission to ICU on the morning of 26-Jul-2023 patient was pronounced dead. Please refer to chart for details.

## 2018-09-02 IMAGING — DX DG CHEST 2V
2 series · 2 of 2 positions shown · non-contrast
Comparison: Six days ago and other priors

CLINICAL DATA: CHF

EXAM:
CHEST - 2 VIEW

[chest pa]
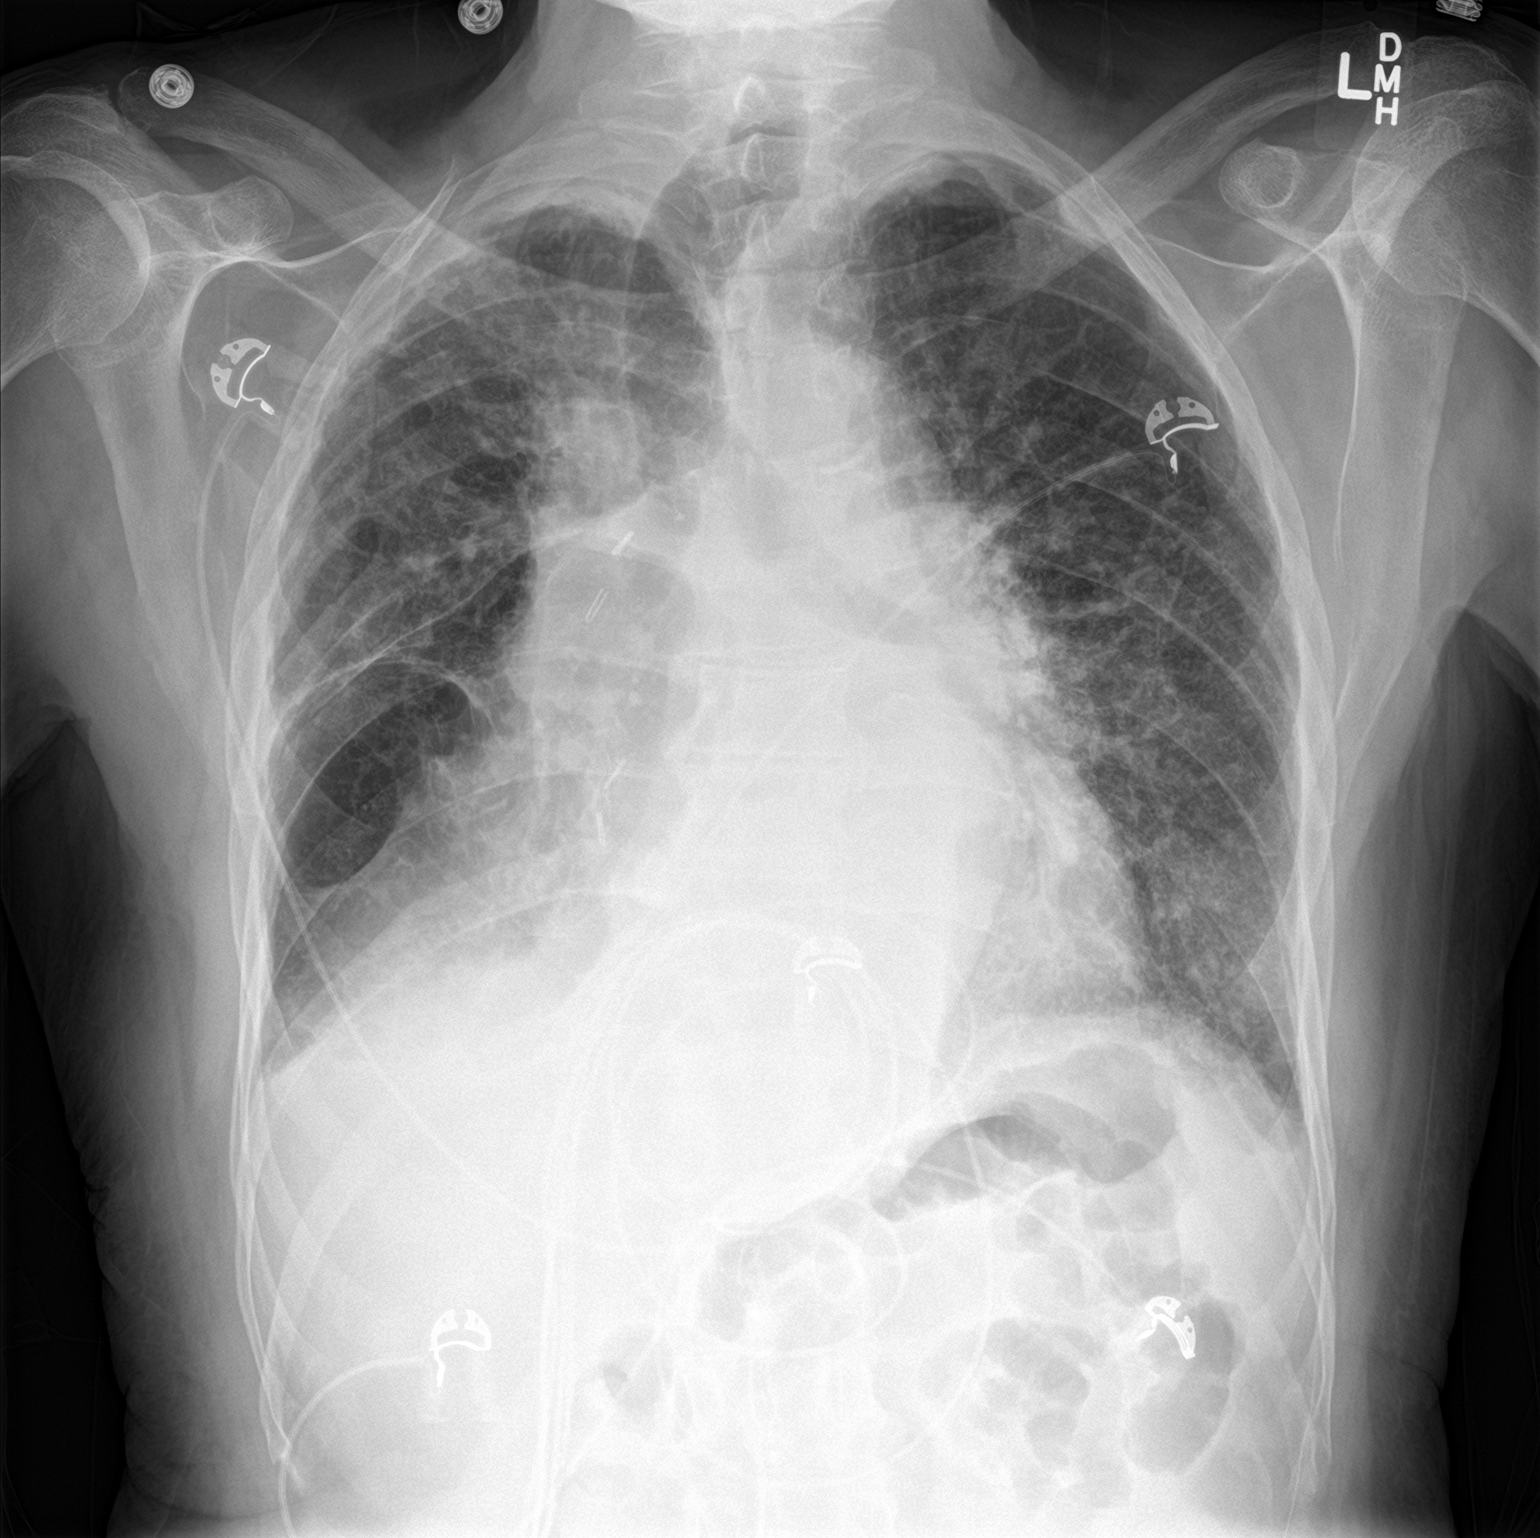

[chest lat]
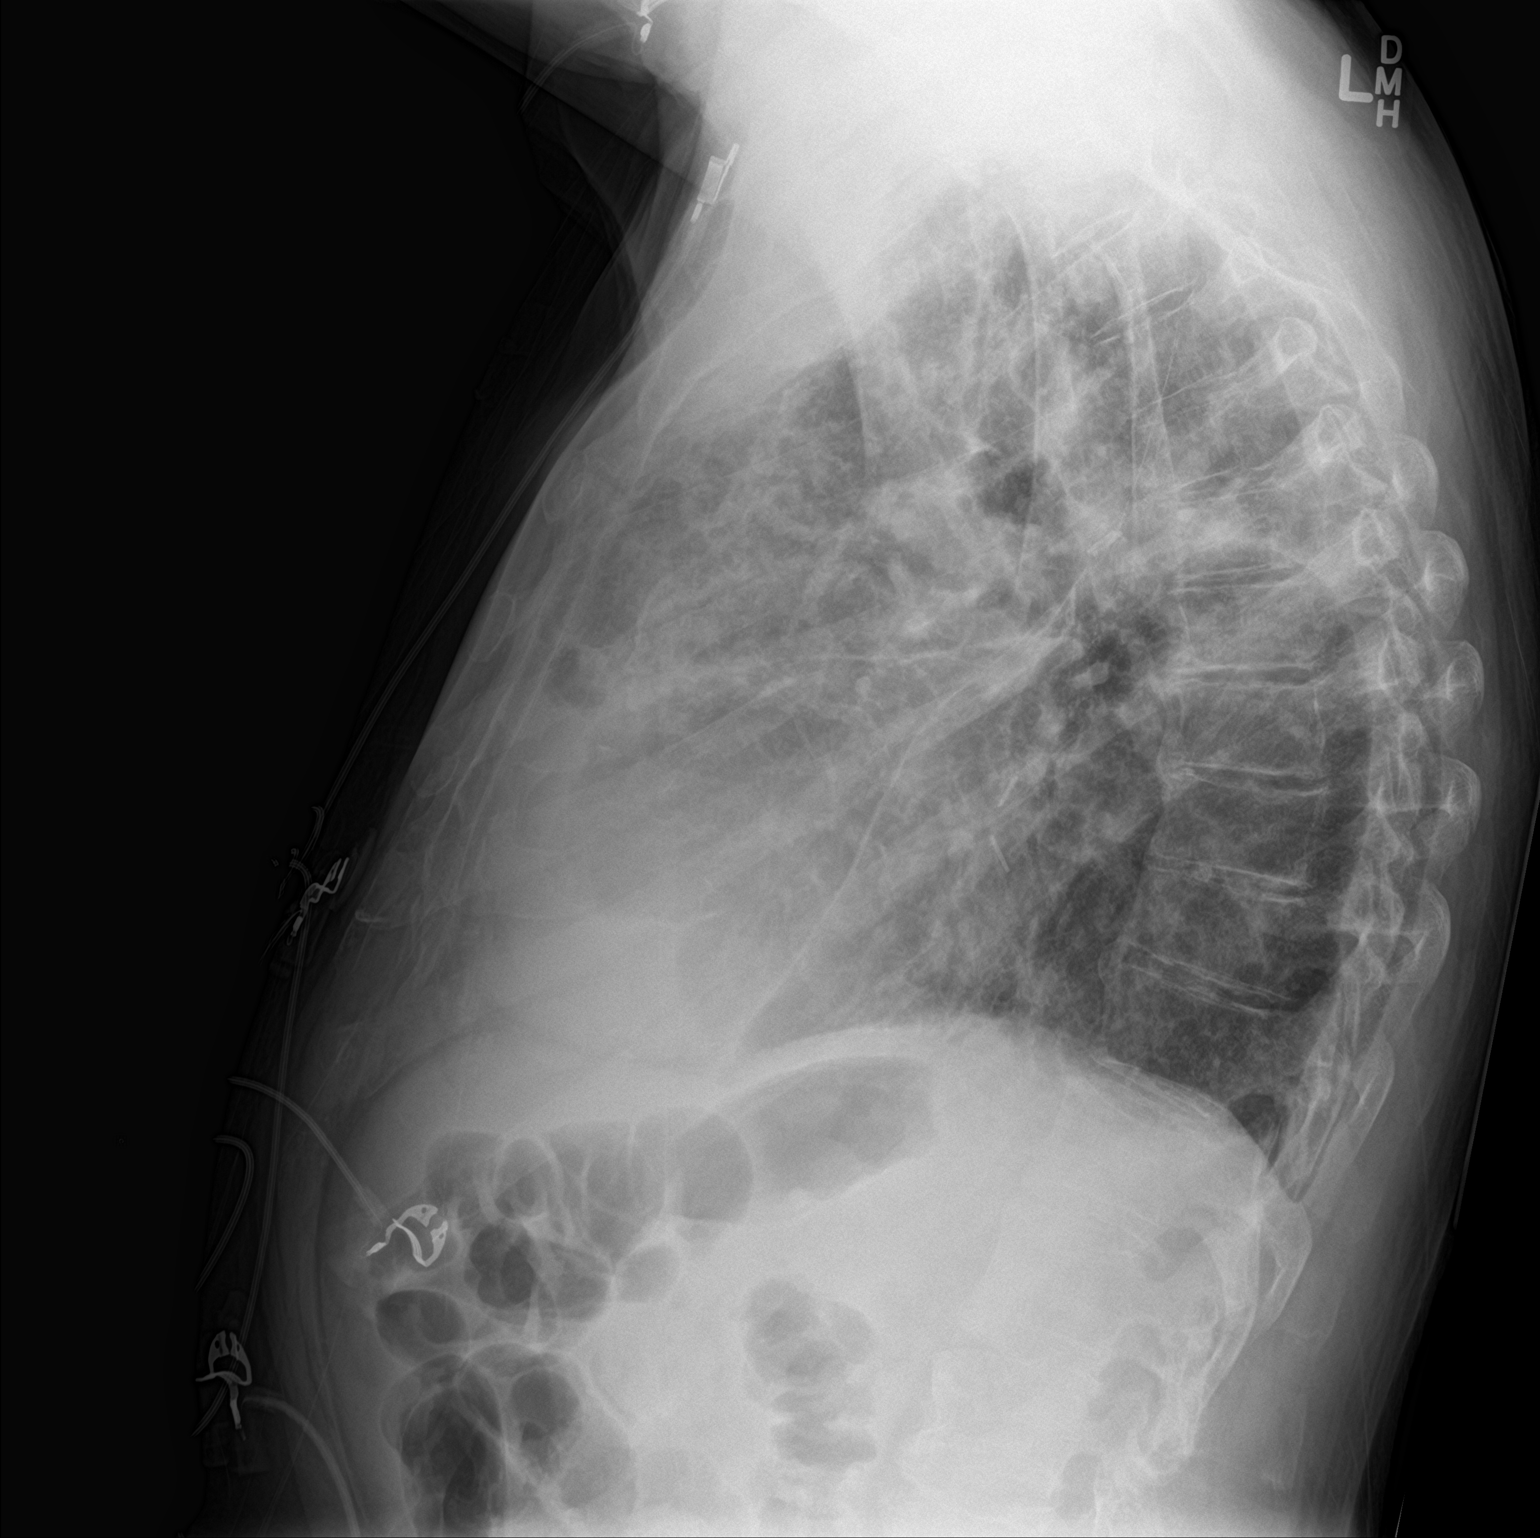

[2 of 2 positions shown; findings below may reference images not displayed]

FINDINGS: Stable cardiomegaly and aortic tortuosity. Generalized interstitial
coarsening. Postoperative right lung with hilar clips, architectural
distortion, and asymmetric pleural thickening.
IMPRESSION: Baseline appearance of the postoperative chest.
